# Patient Record
Sex: Female | Born: 1945
Health system: Southern US, Community
[De-identification: ages and names within clinical notes are randomized; demographics above are authoritative.]

## PROBLEM LIST (undated history)

## (undated) DIAGNOSIS — E079 Disorder of thyroid, unspecified: Secondary | ICD-10-CM

## (undated) DIAGNOSIS — E785 Hyperlipidemia, unspecified: Secondary | ICD-10-CM

## (undated) HISTORY — DX: Hyperlipidemia, unspecified: E78.5

---

## 2000-07-07 ENCOUNTER — Other Ambulatory Visit: Admission: RE | Admit: 2000-07-07 | Discharge: 2000-07-07 | Payer: Self-pay | Admitting: Family Medicine

## 2002-07-21 ENCOUNTER — Ambulatory Visit (HOSPITAL_COMMUNITY): Admission: RE | Admit: 2002-07-21 | Discharge: 2002-07-21 | Payer: Self-pay | Admitting: Internal Medicine

## 2002-09-20 ENCOUNTER — Other Ambulatory Visit: Admission: RE | Admit: 2002-09-20 | Discharge: 2002-09-20 | Payer: Self-pay | Admitting: Family Medicine

## 2003-12-04 ENCOUNTER — Other Ambulatory Visit: Admission: RE | Admit: 2003-12-04 | Discharge: 2003-12-04 | Payer: Self-pay | Admitting: Family Medicine

## 2011-02-16 ENCOUNTER — Ambulatory Visit
Admission: RE | Admit: 2011-02-16 | Discharge: 2011-02-16 | Disposition: A | Discharge status: Home / Self Care | Payer: BC Managed Care – PPO | Source: Ambulatory Visit | Attending: Gastroenterology | Admitting: Gastroenterology

## 2011-02-16 ENCOUNTER — Other Ambulatory Visit: Payer: Self-pay | Admitting: Gastroenterology

## 2012-07-27 IMAGING — US US ABDOMEN COMPLETE
1 series · 14 of 25 positions shown · non-contrast
Comparison: None.

CLINICAL DATA: Abdominal pain, nausea

COMPLETE ABDOMINAL ULTRASOUND

[Series 1: us abdomen complete · 0.33mm/px · 14 of 78 slices shown]
[im 1/78]
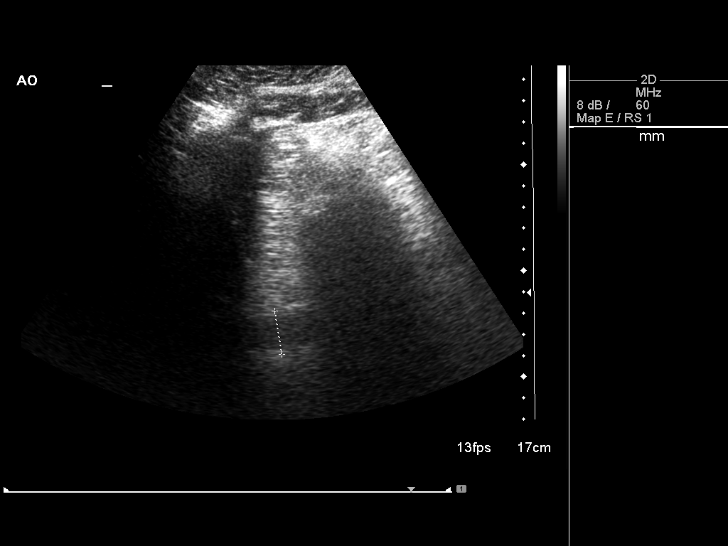
[im 7/78]
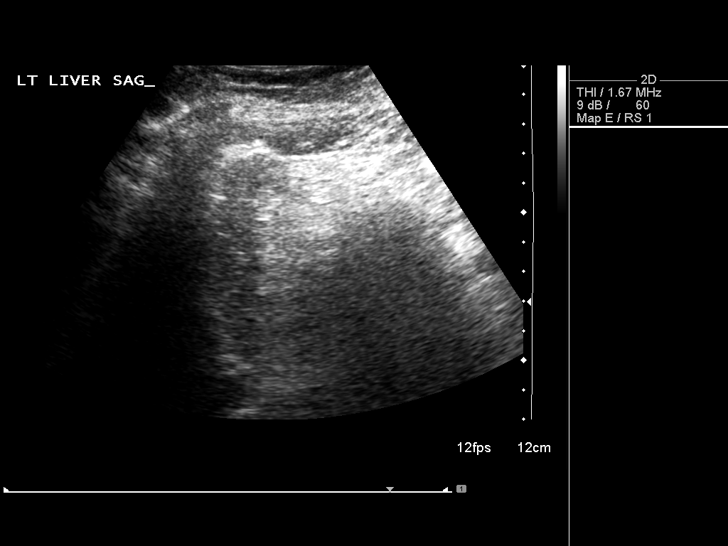
[im 13/78]
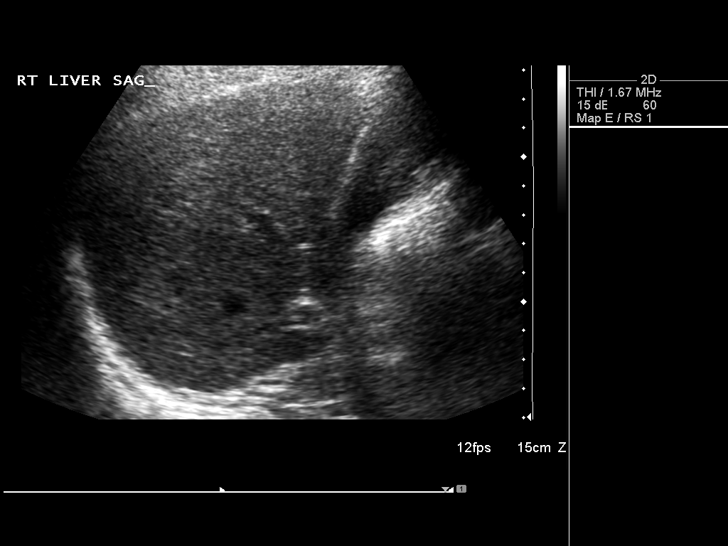
[im 20/78]
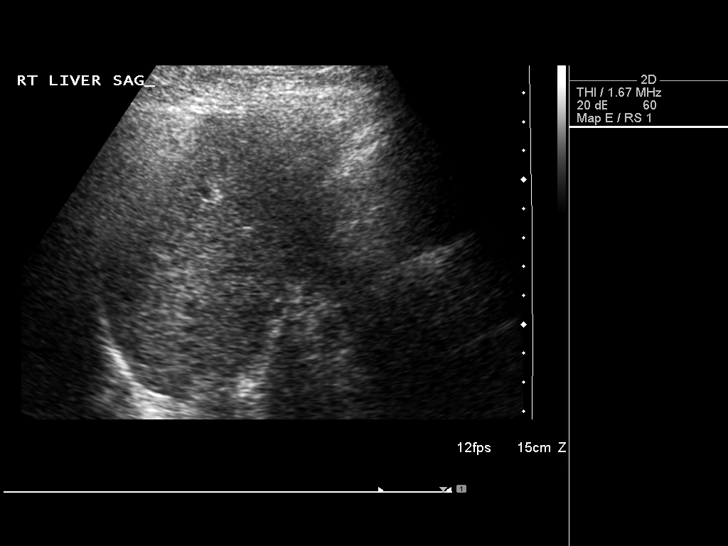
[im 26/78]
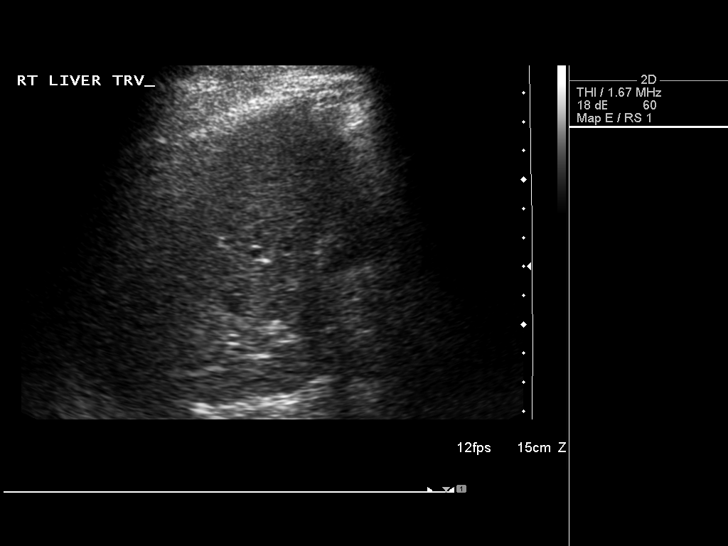
[im 29/78]
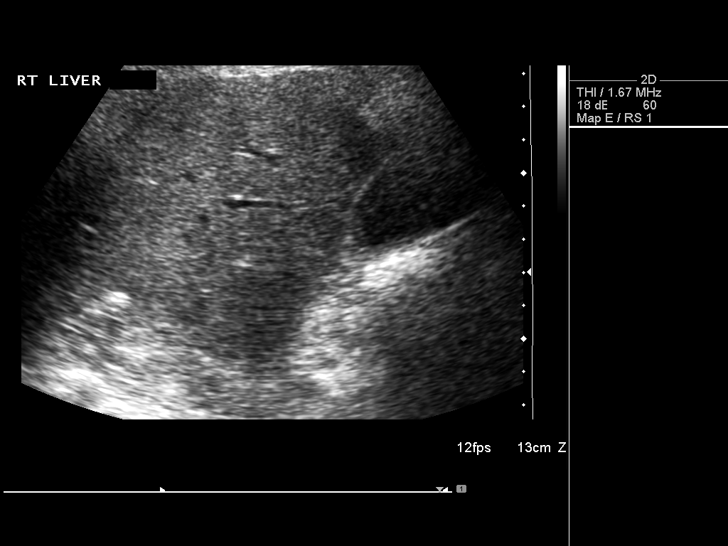
[im 36/78]
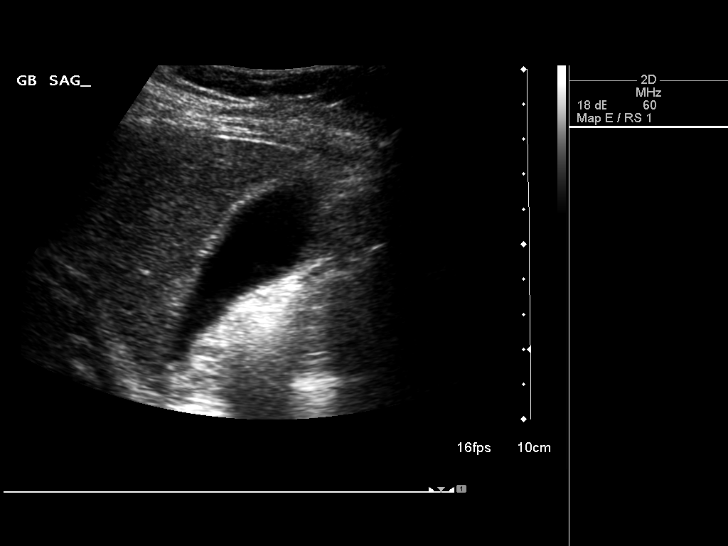
[im 42/78]
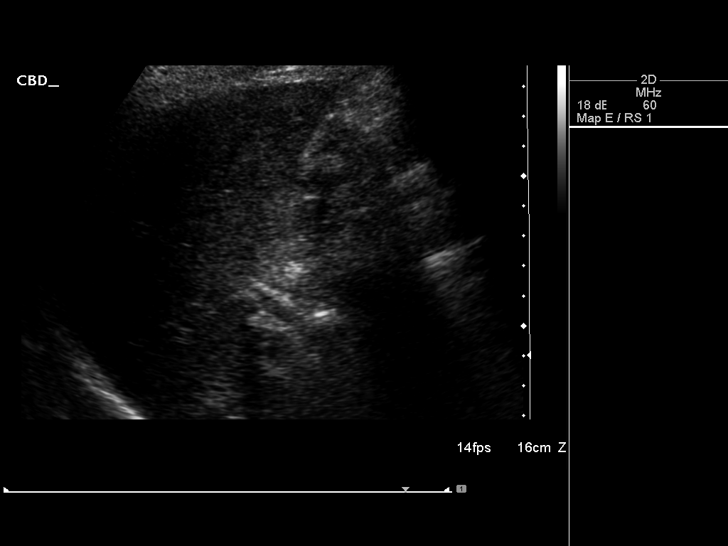
[im 49/78]
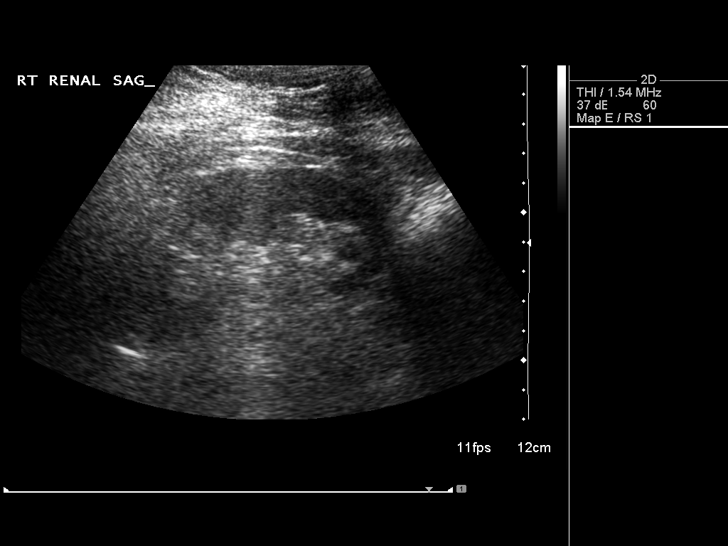
[im 52/78]
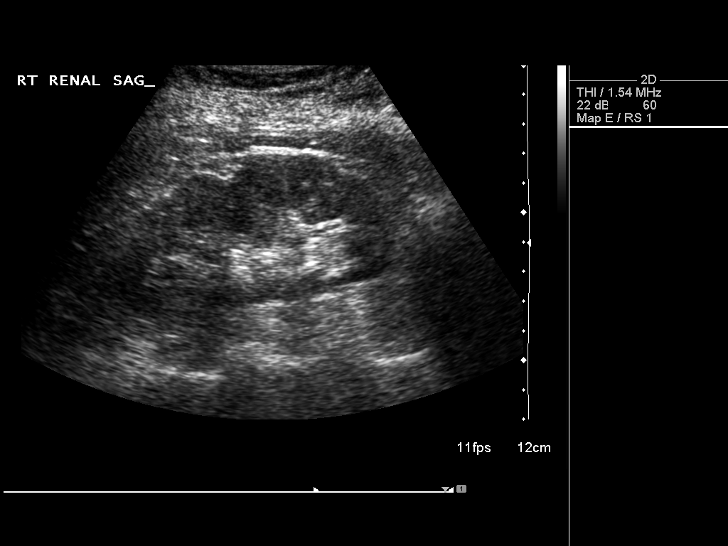
[im 58/78]
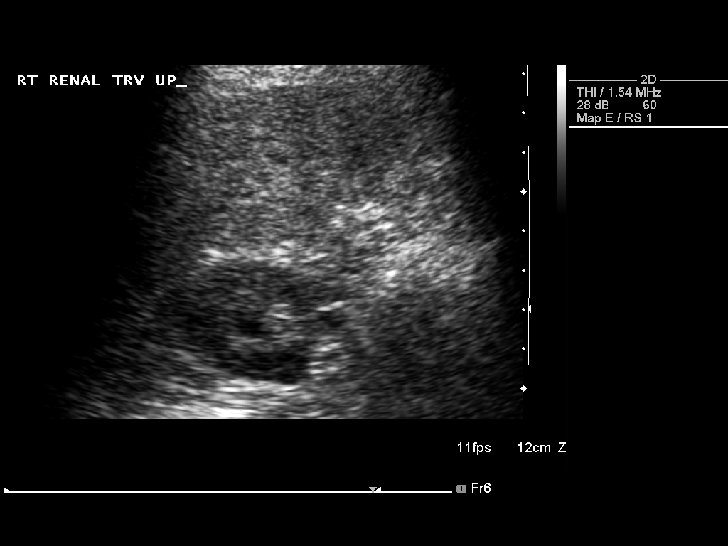
[im 65/78]
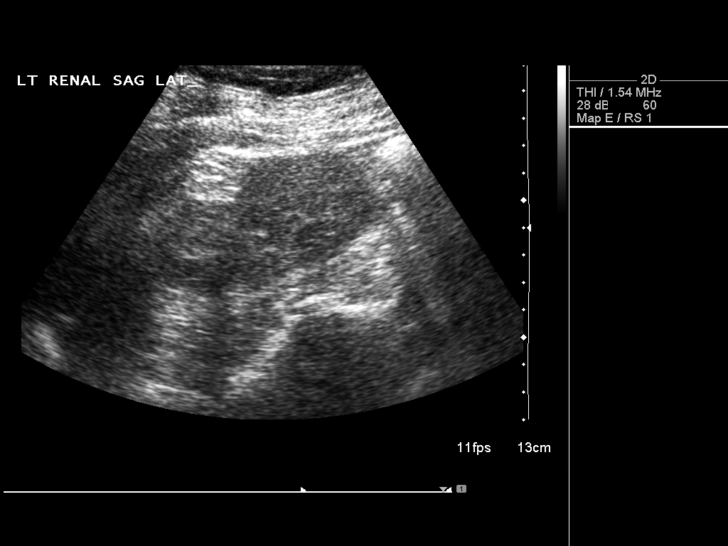
[im 71/78]
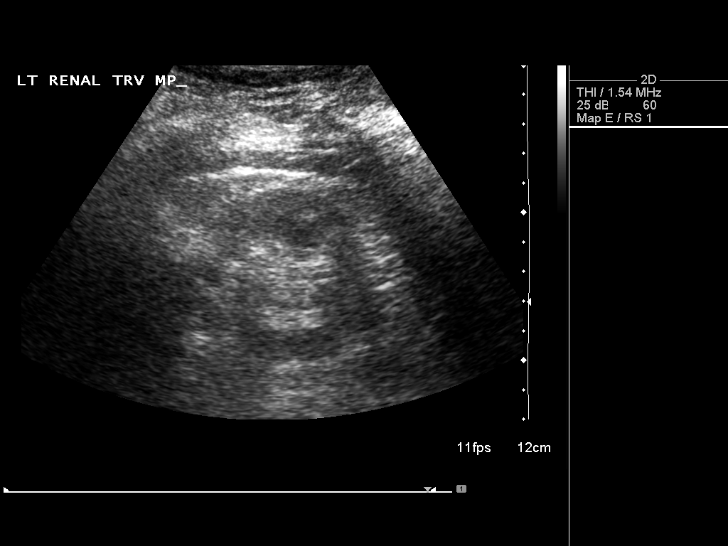
[im 78/78]
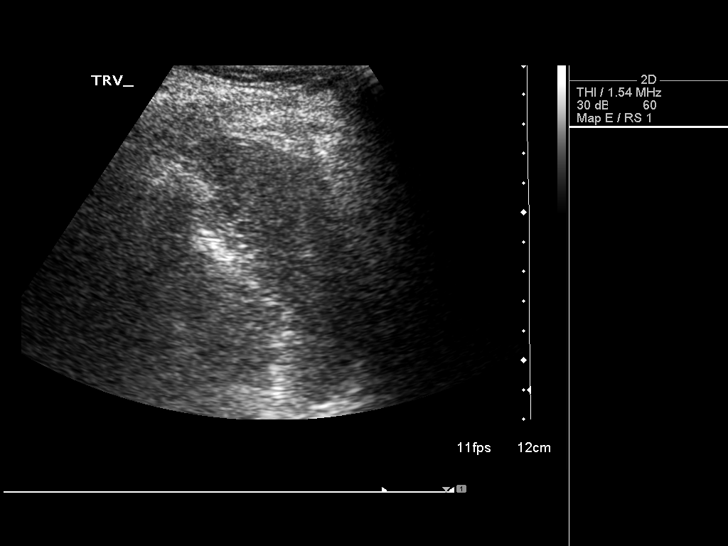

[14 of 25 positions shown; findings below may reference images not displayed]

FINDINGS: Gallbladder:  The gallbladder is visualized and no gallstones are
noted.  There is no pain over gallbladder with compression.

Common bile duct:  The common bile duct is normal measuring 5.0 mm
in diameter.

Liver:  The liver has a normal echogenic pattern.  No ductal
dilatation is seen.

IVC:  Appears normal.

Pancreas:  The pancreas is largely obscured by bowel gas.

Spleen:  The spleen is normal measuring 7.5 cm sagittally.

Right Kidney:  No hydronephrosis is seen.  The right kidney
measures 9.8 cm sagittally.

Left Kidney:  No hydronephrosis.  The left kidney measures 10.4 cm.

Abdominal aorta:  The abdominal aorta is normal in caliber.
IMPRESSION: Negative abdominal ultrasound.  No gallstones.  The pancreas
however is obscured by bowel gas.

## 2015-04-08 DIAGNOSIS — M25561 Pain in right knee: Secondary | ICD-10-CM | POA: Diagnosis not present

## 2015-04-11 DIAGNOSIS — M1711 Unilateral primary osteoarthritis, right knee: Secondary | ICD-10-CM | POA: Diagnosis not present

## 2015-04-11 DIAGNOSIS — M25561 Pain in right knee: Secondary | ICD-10-CM | POA: Diagnosis not present

## 2015-05-28 DIAGNOSIS — E039 Hypothyroidism, unspecified: Secondary | ICD-10-CM | POA: Diagnosis not present

## 2015-09-25 DIAGNOSIS — H0012 Chalazion right lower eyelid: Secondary | ICD-10-CM | POA: Diagnosis not present

## 2015-10-21 DIAGNOSIS — H00022 Hordeolum internum right lower eyelid: Secondary | ICD-10-CM | POA: Diagnosis not present

## 2015-12-25 DIAGNOSIS — E039 Hypothyroidism, unspecified: Secondary | ICD-10-CM | POA: Diagnosis not present

## 2016-01-20 DIAGNOSIS — H903 Sensorineural hearing loss, bilateral: Secondary | ICD-10-CM | POA: Diagnosis not present

## 2016-03-02 DIAGNOSIS — H524 Presbyopia: Secondary | ICD-10-CM | POA: Diagnosis not present

## 2016-03-02 DIAGNOSIS — H40013 Open angle with borderline findings, low risk, bilateral: Secondary | ICD-10-CM | POA: Diagnosis not present

## 2016-03-02 DIAGNOSIS — H5203 Hypermetropia, bilateral: Secondary | ICD-10-CM | POA: Diagnosis not present

## 2016-03-02 DIAGNOSIS — H52203 Unspecified astigmatism, bilateral: Secondary | ICD-10-CM | POA: Diagnosis not present

## 2016-03-02 DIAGNOSIS — H2513 Age-related nuclear cataract, bilateral: Secondary | ICD-10-CM | POA: Diagnosis not present

## 2016-09-06 DIAGNOSIS — H40013 Open angle with borderline findings, low risk, bilateral: Secondary | ICD-10-CM | POA: Diagnosis not present

## 2017-04-06 DIAGNOSIS — H40013 Open angle with borderline findings, low risk, bilateral: Secondary | ICD-10-CM | POA: Diagnosis not present

## 2017-04-06 DIAGNOSIS — H5203 Hypermetropia, bilateral: Secondary | ICD-10-CM | POA: Diagnosis not present

## 2017-04-06 DIAGNOSIS — H2513 Age-related nuclear cataract, bilateral: Secondary | ICD-10-CM | POA: Diagnosis not present

## 2017-04-06 DIAGNOSIS — H524 Presbyopia: Secondary | ICD-10-CM | POA: Diagnosis not present

## 2017-04-06 DIAGNOSIS — H52203 Unspecified astigmatism, bilateral: Secondary | ICD-10-CM | POA: Diagnosis not present

## 2017-05-09 ENCOUNTER — Other Ambulatory Visit: Payer: Self-pay | Admitting: Family Medicine

## 2017-05-09 ENCOUNTER — Ambulatory Visit
Admission: RE | Admit: 2017-05-09 | Discharge: 2017-05-09 | Disposition: A | Discharge status: Home / Self Care | Payer: Medicare HMO | Source: Ambulatory Visit | Attending: Family Medicine | Admitting: Family Medicine

## 2017-05-09 DIAGNOSIS — E785 Hyperlipidemia, unspecified: Secondary | ICD-10-CM | POA: Diagnosis not present

## 2017-05-09 DIAGNOSIS — J4531 Mild persistent asthma with (acute) exacerbation: Secondary | ICD-10-CM | POA: Diagnosis not present

## 2017-05-09 DIAGNOSIS — R0600 Dyspnea, unspecified: Secondary | ICD-10-CM | POA: Diagnosis not present

## 2017-05-09 DIAGNOSIS — E559 Vitamin D deficiency, unspecified: Secondary | ICD-10-CM | POA: Diagnosis not present

## 2017-05-09 DIAGNOSIS — M79602 Pain in left arm: Secondary | ICD-10-CM | POA: Diagnosis not present

## 2017-05-09 DIAGNOSIS — E039 Hypothyroidism, unspecified: Secondary | ICD-10-CM | POA: Diagnosis not present

## 2017-05-09 DIAGNOSIS — Z23 Encounter for immunization: Secondary | ICD-10-CM | POA: Diagnosis not present

## 2017-05-09 DIAGNOSIS — J90 Pleural effusion, not elsewhere classified: Secondary | ICD-10-CM | POA: Diagnosis not present

## 2017-10-07 ENCOUNTER — Encounter (HOSPITAL_COMMUNITY): Payer: Self-pay

## 2017-10-07 ENCOUNTER — Other Ambulatory Visit: Payer: Self-pay

## 2017-10-07 ENCOUNTER — Emergency Department (HOSPITAL_COMMUNITY)
Admission: EM | Admit: 2017-10-07 | Discharge: 2017-10-08 | Disposition: A | Discharge status: Home / Self Care | Payer: Medicare HMO | Attending: Emergency Medicine | Admitting: Emergency Medicine

## 2017-10-07 DIAGNOSIS — Y939 Activity, unspecified: Secondary | ICD-10-CM | POA: Diagnosis not present

## 2017-10-07 DIAGNOSIS — Y92009 Unspecified place in unspecified non-institutional (private) residence as the place of occurrence of the external cause: Secondary | ICD-10-CM | POA: Diagnosis not present

## 2017-10-07 DIAGNOSIS — W5911XA Bitten by nonvenomous snake, initial encounter: Secondary | ICD-10-CM | POA: Insufficient documentation

## 2017-10-07 DIAGNOSIS — Z79899 Other long term (current) drug therapy: Secondary | ICD-10-CM | POA: Insufficient documentation

## 2017-10-07 DIAGNOSIS — S91052A Open bite, left ankle, initial encounter: Secondary | ICD-10-CM | POA: Insufficient documentation

## 2017-10-07 DIAGNOSIS — Y999 Unspecified external cause status: Secondary | ICD-10-CM | POA: Insufficient documentation

## 2017-10-07 DIAGNOSIS — T63001A Toxic effect of unspecified snake venom, accidental (unintentional), initial encounter: Secondary | ICD-10-CM | POA: Diagnosis not present

## 2017-10-07 HISTORY — DX: Disorder of thyroid, unspecified: E07.9

## 2017-10-07 LAB — CBC WITH DIFFERENTIAL/PLATELET
BASOS ABS: 0 10*3/uL (ref 0.0–0.1)
BASOS PCT: 0 %
EOS PCT: 2 %
Eosinophils Absolute: 0.2 10*3/uL (ref 0.0–0.7)
HCT: 41.9 % (ref 36.0–46.0)
Hemoglobin: 13.7 g/dL (ref 12.0–15.0)
Lymphocytes Relative: 42 %
Lymphs Abs: 3.8 10*3/uL (ref 0.7–4.0)
MCH: 29.8 pg (ref 26.0–34.0)
MCHC: 32.7 g/dL (ref 30.0–36.0)
MCV: 91.3 fL (ref 78.0–100.0)
MONO ABS: 0.7 10*3/uL (ref 0.1–1.0)
MONOS PCT: 8 %
Neutro Abs: 4.4 10*3/uL (ref 1.7–7.7)
Neutrophils Relative %: 48 %
PLATELETS: 286 10*3/uL (ref 150–400)
RBC: 4.59 MIL/uL (ref 3.87–5.11)
RDW: 13.8 % (ref 11.5–15.5)
WBC: 9.1 10*3/uL (ref 4.0–10.5)

## 2017-10-07 LAB — BASIC METABOLIC PANEL
Anion gap: 7 (ref 5–15)
BUN: 12 mg/dL (ref 8–23)
CO2: 23 mmol/L (ref 22–32)
Calcium: 9 mg/dL (ref 8.9–10.3)
Chloride: 109 mmol/L (ref 98–111)
Creatinine, Ser: 1.1 mg/dL — ABNORMAL HIGH (ref 0.44–1.00)
GFR calc Af Amer: 56 mL/min — ABNORMAL LOW (ref >60)
GFR, EST NON AFRICAN AMERICAN: 48 mL/min — AB (ref >60)
GLUCOSE: 97 mg/dL (ref 70–99)
Potassium: 3.6 mmol/L (ref 3.5–5.1)
Sodium: 139 mmol/L (ref 135–145)

## 2017-10-07 LAB — FIBRINOGEN: FIBRINOGEN: 309 mg/dL (ref 210–475)

## 2017-10-07 LAB — PROTIME-INR
INR: 0.97
Prothrombin Time: 12.8 seconds (ref 11.4–15.2)

## 2017-10-07 MED ORDER — ONDANSETRON HCL 4 MG/2ML IJ SOLN
4.0000 mg | Freq: Once | INTRAMUSCULAR | Status: AC
  Administered 2017-10-07: 4 mg via INTRAVENOUS
  Filled 2017-10-07: qty 2

## 2017-10-07 MED ORDER — SODIUM CHLORIDE 0.9 % IV BOLUS
1000.0000 mL | Freq: Once | INTRAVENOUS | Status: AC
  Administered 2017-10-07: 1000 mL via INTRAVENOUS

## 2017-10-07 MED ORDER — TETANUS-DIPHTH-ACELL PERTUSSIS 5-2.5-18.5 LF-MCG/0.5 IM SUSP
0.5000 mL | Freq: Once | INTRAMUSCULAR | Status: DC
  Filled 2017-10-07: qty 0.5

## 2017-10-07 MED ORDER — HYDROCODONE-ACETAMINOPHEN 5-325 MG PO TABS: 1.0000 | ORAL_TABLET | ORAL | 0 refills | Status: DC | PRN

## 2017-10-07 MED ORDER — MORPHINE SULFATE (PF) 4 MG/ML IV SOLN
4.0000 mg | Freq: Once | INTRAVENOUS | Status: AC
  Administered 2017-10-07: 4 mg via INTRAVENOUS
  Filled 2017-10-07: qty 1

## 2017-10-07 MED ORDER — FENTANYL CITRATE (PF) 100 MCG/2ML IJ SOLN
50.0000 ug | INTRAMUSCULAR | Status: DC | PRN
  Administered 2017-10-07: 50 ug via INTRAVENOUS
  Filled 2017-10-07: qty 2

## 2017-10-07 NOTE — ED Provider Notes (Signed)
[facility] EMERGENCY DEPARTMENT Provider Note   CSN: [id number] Arrival date & time: 10/07/17  2050     History   Chief Complaint Chief Complaint  Patient presents with  . Snake Bite    HPI Melanie Butler is a 72 y.o. female.  Pt presents to the ED today with snake bite to left ankle.  The pt said she walked outside and felt something on her leg and saw the snake.  She brought the snake (dead) in for identification.  The pt put a tourniquet on her left leg prior to arrival here.     Past Medical History:  Diagnosis Date  . Thyroid disease     There are no active problems to display for this patient.   History reviewed. No pertinent surgical history.   OB History   None      Home Medications    Prior to Admission medications   Medication Sig Start Date End Date Taking? Authorizing Provider  albuterol (PROVENTIL HFA;VENTOLIN HFA) 108 (90 Base) MCG/ACT inhaler Inhale 1-2 puffs into the lungs every 6 (six) hours as needed for wheezing or shortness of breath.   Yes [provider]  Cholecalciferol (HM VITAMIN D3) 4000 units CAPS Take 4,000 Units by mouth daily.   Yes [provider]  levothyroxine (SYNTHROID, LEVOTHROID) 150 MCG tablet Take 150 mcg by mouth daily before breakfast.  10/03/17  Yes [provider]  HYDROcodone-acetaminophen (NORCO/VICODIN) 5-325 MG tablet Take 1 tablet by mouth every 4 (four) hours as needed. 10/07/17   Isla Pence, MD    Family History No family history on file.  Social History Social History   Tobacco Use  . Smoking status: Never Smoker  . Smokeless tobacco: Never Used  Substance Use Topics  . Alcohol use: Never    Frequency: Never  . Drug use: Never     Allergies   Ceclor [cefaclor] and Tetanus toxoids   Review of Systems Review of Systems  Skin: Positive for wound.  All other systems reviewed and are negative.    Physical Exam Updated Vital Signs BP 126/67   Pulse 66   Temp 97.7 F  (36.5 C) (Oral)   Resp 15   Ht 5\' 5"  (1.651 m)   Wt 74.8 kg   SpO2 95%   BMI 27.46 kg/m   Physical Exam  Constitutional: She is oriented to person, place, and time. She appears well-developed and well-nourished.  HENT:  Head: Normocephalic and atraumatic.  Right Ear: External ear normal.  Left Ear: External ear normal.  Nose: Nose normal.  Mouth/Throat: Oropharynx is clear and moist.  Eyes: Pupils are equal, round, and reactive to light. Conjunctivae and EOM are normal.  Neck: Normal range of motion. Neck supple.  Cardiovascular: Normal rate, regular rhythm, normal heart sounds and intact distal pulses.  Pulmonary/Chest: Effort normal and breath sounds normal.  Abdominal: Soft. Bowel sounds are normal.  Musculoskeletal:  2 puncture wounds to medial left ankle.  Mild swelling and redness around site.  Neurological: She is alert and oriented to person, place, and time.  Skin: Skin is warm. Capillary refill takes less than 2 seconds.  See picture (1 hr post bite)  Psychiatric: She has a normal mood and affect. Her behavior is normal. Judgment and thought content normal.  Nursing note and vitals reviewed.        ED Treatments / Results  Labs (all labs ordered are listed, but only abnormal results are displayed) Labs Reviewed  BASIC METABOLIC  PANEL - Abnormal; Notable for the following components:      Result Value   Creatinine, Ser 1.10 (*)    GFR calc non Af Amer 48 (*)    GFR calc Af Amer 56 (*)    All other components within normal limits  CBC WITH DIFFERENTIAL/PLATELET  PROTIME-INR  FIBRINOGEN  CBC WITH DIFFERENTIAL/PLATELET  CBC WITH DIFFERENTIAL/PLATELET  PROTIME-INR  PROTIME-INR  FIBRINOGEN  FIBRINOGEN    EKG EKG Interpretation  Date/Time:  Friday October 07 2017 21:02:10 EDT Ventricular Rate:  73 PR Interval:    QRS Duration: 101 QT Interval:  403 QTC Calculation: 445 R Axis:   70 Text Interpretation:  Sinus rhythm Baseline wander in lead(s) V3 No  old tracing to compare Confirmed by Isla Pence [phone]) on 10/07/2017 9:22:47 PM   Radiology No results found.  Procedures Procedures (including critical care time)  Medications Ordered in ED Medications  fentaNYL (SUBLIMAZE) injection 50 mcg (has no administration in time range)  sodium chloride 0.9 % bolus 1,000 mL (0 mLs Intravenous Stopped 10/07/17 2210)  morphine 4 MG/ML injection 4 mg (4 mg Intravenous Given 10/07/17 2203)  ondansetron (ZOFRAN) injection 4 mg (4 mg Intravenous Given 10/07/17 2203)     Initial Impression / Assessment and Plan / ED Course  I have reviewed the triage vital signs and the nursing notes.  Pertinent labs & imaging results that were available during my care of the patient were reviewed by me and considered in my medical decision making (see chart for details).    I suspect pt did not get a very large envenomation as foot is not very swollen. She does not meet criteria for crofab.  Pt will be signed out to Dr. Eliane Decree pending observation until 0100.    Final Clinical Impressions(s) / ED Diagnoses   Final diagnoses:  Snake bite, initial encounter    ED Discharge Orders         Ordered    HYDROcodone-acetaminophen (NORCO/VICODIN) 5-325 MG tablet  Every 4 hours PRN     10/07/17 2327           Isla Pence, MD 10/07/17 2342

## 2017-10-07 NOTE — ED Triage Notes (Signed)
Pt was bit on the inside of her left ankle by a copperhead (confirmed by positive identification-dead snake brought to e.d.).  Pt having minimal swelling or redness to area of 2 puncture marks.

## 2017-10-08 DIAGNOSIS — Z79899 Other long term (current) drug therapy: Secondary | ICD-10-CM | POA: Diagnosis not present

## 2017-10-08 DIAGNOSIS — S91052A Open bite, left ankle, initial encounter: Secondary | ICD-10-CM | POA: Diagnosis not present

## 2017-10-08 DIAGNOSIS — W5911XA Bitten by nonvenomous snake, initial encounter: Secondary | ICD-10-CM | POA: Diagnosis not present

## 2017-10-08 LAB — CBC WITH DIFFERENTIAL/PLATELET
BASOS PCT: 0 %
Basophils Absolute: 0 10*3/uL (ref 0.0–0.1)
EOS ABS: 0.1 10*3/uL (ref 0.0–0.7)
Eosinophils Relative: 2 %
HEMATOCRIT: 39.9 % (ref 36.0–46.0)
Hemoglobin: 12.8 g/dL (ref 12.0–15.0)
Lymphocytes Relative: 26 %
Lymphs Abs: 2.2 10*3/uL (ref 0.7–4.0)
MCH: 29.4 pg (ref 26.0–34.0)
MCHC: 32.1 g/dL (ref 30.0–36.0)
MCV: 91.5 fL (ref 78.0–100.0)
MONO ABS: 0.4 10*3/uL (ref 0.1–1.0)
Monocytes Relative: 5 %
NEUTROS ABS: 5.8 10*3/uL (ref 1.7–7.7)
Neutrophils Relative %: 67 %
PLATELETS: 258 10*3/uL (ref 150–400)
RBC: 4.36 MIL/uL (ref 3.87–5.11)
RDW: 13.8 % (ref 11.5–15.5)
WBC: 8.6 10*3/uL (ref 4.0–10.5)

## 2017-10-08 LAB — PROTIME-INR
INR: 1.05
Prothrombin Time: 13.6 seconds (ref 11.4–15.2)

## 2017-10-08 LAB — FIBRINOGEN: FIBRINOGEN: 263 mg/dL (ref 210–475)

## 2017-10-08 NOTE — Discharge Instructions (Signed)
Recheck for any worsening symptoms.

## 2017-10-08 NOTE — ED Provider Notes (Signed)
Pt states swelling is about the same. ? Small swelling outside of the marked area.   Second set of blood work is still normal.  Patient was discharged home.    Results for orders placed or performed during the hospital encounter of 42/70/62  Basic metabolic panel  Result Value Ref Range   Sodium 139 135 - 145 mmol/L   Potassium 3.6 3.5 - 5.1 mmol/L   Chloride 109 98 - 111 mmol/L   CO2 23 22 - 32 mmol/L   Glucose, Bld 97 70 - 99 mg/dL   BUN 12 8 - 23 mg/dL   Creatinine, Ser 1.10 (H) 0.44 - 1.00 mg/dL   Calcium 9.0 8.9 - 10.3 mg/dL   GFR calc non Af Amer 48 (L) >60 mL/min   GFR calc Af Amer 56 (L) >60 mL/min   Anion gap 7 5 - 15  CBC WITH DIFFERENTIAL  Result Value Ref Range   WBC 9.1 4.0 - 10.5 K/uL   RBC 4.59 3.87 - 5.11 MIL/uL   Hemoglobin 13.7 12.0 - 15.0 g/dL   HCT 41.9 36.0 - 46.0 %   MCV 91.3 78.0 - 100.0 fL   MCH 29.8 26.0 - 34.0 pg   MCHC 32.7 30.0 - 36.0 g/dL   RDW 13.8 11.5 - 15.5 %   Platelets 286 150 - 400 K/uL   Neutrophils Relative % 48 %   Neutro Abs 4.4 1.7 - 7.7 K/uL   Lymphocytes Relative 42 %   Lymphs Abs 3.8 0.7 - 4.0 K/uL   Monocytes Relative 8 %   Monocytes Absolute 0.7 0.1 - 1.0 K/uL   Eosinophils Relative 2 %   Eosinophils Absolute 0.2 0.0 - 0.7 K/uL   Basophils Relative 0 %   Basophils Absolute 0.0 0.0 - 0.1 K/uL  CBC WITH DIFFERENTIAL  Result Value Ref Range   WBC 8.6 4.0 - 10.5 K/uL   RBC 4.36 3.87 - 5.11 MIL/uL   Hemoglobin 12.8 12.0 - 15.0 g/dL   HCT 39.9 36.0 - 46.0 %   MCV 91.5 78.0 - 100.0 fL   MCH 29.4 26.0 - 34.0 pg   MCHC 32.1 30.0 - 36.0 g/dL   RDW 13.8 11.5 - 15.5 %   Platelets 258 150 - 400 K/uL   Neutrophils Relative % 67 %   Neutro Abs 5.8 1.7 - 7.7 K/uL   Lymphocytes Relative 26 %   Lymphs Abs 2.2 0.7 - 4.0 K/uL   Monocytes Relative 5 %   Monocytes Absolute 0.4 0.1 - 1.0 K/uL   Eosinophils Relative 2 %   Eosinophils Absolute 0.1 0.0 - 0.7 K/uL   Basophils Relative 0 %   Basophils Absolute 0.0 0.0 - 0.1 K/uL    Protime-INR  Result Value Ref Range   Prothrombin Time 12.8 11.4 - 15.2 seconds   INR 0.97   Protime-INR  Result Value Ref Range   Prothrombin Time 13.6 11.4 - 15.2 seconds   INR 1.05   Fibrinogen  Result Value Ref Range   Fibrinogen 309 210 - 475 mg/dL  Fibrinogen  Result Value Ref Range   Fibrinogen 263 210 - 475 mg/dL   Laboratory interpretation all normal except renal insufficiency     Rolland Porter, MD 10/08/17 3762

## 2017-10-10 DIAGNOSIS — S91052D Open bite, left ankle, subsequent encounter: Secondary | ICD-10-CM | POA: Diagnosis not present

## 2017-10-10 DIAGNOSIS — W5911XD Bitten by nonvenomous snake, subsequent encounter: Secondary | ICD-10-CM | POA: Diagnosis not present

## 2017-10-10 DIAGNOSIS — M79605 Pain in left leg: Secondary | ICD-10-CM | POA: Diagnosis not present

## 2017-10-11 DIAGNOSIS — M7989 Other specified soft tissue disorders: Secondary | ICD-10-CM | POA: Diagnosis not present

## 2018-01-02 DIAGNOSIS — L821 Other seborrheic keratosis: Secondary | ICD-10-CM | POA: Diagnosis not present

## 2018-01-05 DIAGNOSIS — Z1231 Encounter for screening mammogram for malignant neoplasm of breast: Secondary | ICD-10-CM | POA: Diagnosis not present

## 2018-01-30 DIAGNOSIS — H40013 Open angle with borderline findings, low risk, bilateral: Secondary | ICD-10-CM | POA: Diagnosis not present

## 2018-03-15 DIAGNOSIS — E559 Vitamin D deficiency, unspecified: Secondary | ICD-10-CM | POA: Diagnosis not present

## 2018-03-15 DIAGNOSIS — R222 Localized swelling, mass and lump, trunk: Secondary | ICD-10-CM | POA: Diagnosis not present

## 2018-03-15 DIAGNOSIS — E785 Hyperlipidemia, unspecified: Secondary | ICD-10-CM | POA: Diagnosis not present

## 2018-03-15 DIAGNOSIS — E039 Hypothyroidism, unspecified: Secondary | ICD-10-CM | POA: Diagnosis not present

## 2018-03-15 DIAGNOSIS — N644 Mastodynia: Secondary | ICD-10-CM | POA: Diagnosis not present

## 2018-03-29 DIAGNOSIS — R591 Generalized enlarged lymph nodes: Secondary | ICD-10-CM | POA: Diagnosis not present

## 2018-05-01 DIAGNOSIS — H2513 Age-related nuclear cataract, bilateral: Secondary | ICD-10-CM | POA: Diagnosis not present

## 2018-05-01 DIAGNOSIS — H40013 Open angle with borderline findings, low risk, bilateral: Secondary | ICD-10-CM | POA: Diagnosis not present

## 2018-05-01 DIAGNOSIS — H52203 Unspecified astigmatism, bilateral: Secondary | ICD-10-CM | POA: Diagnosis not present

## 2018-05-01 DIAGNOSIS — H5203 Hypermetropia, bilateral: Secondary | ICD-10-CM | POA: Diagnosis not present

## 2018-05-01 DIAGNOSIS — H25013 Cortical age-related cataract, bilateral: Secondary | ICD-10-CM | POA: Diagnosis not present

## 2018-05-01 DIAGNOSIS — H524 Presbyopia: Secondary | ICD-10-CM | POA: Diagnosis not present

## 2018-05-15 DIAGNOSIS — H903 Sensorineural hearing loss, bilateral: Secondary | ICD-10-CM | POA: Diagnosis not present

## 2018-10-04 DIAGNOSIS — R69 Illness, unspecified: Secondary | ICD-10-CM | POA: Diagnosis not present

## 2018-10-18 IMAGING — DX DG CHEST 2V
2 series · 2 of 2 positions shown · non-contrast
Comparison: None.

CLINICAL DATA: 71-year-old female with a history of chronic
shortness of breath

EXAM:
CHEST - 2 VIEW

[dg chest 2 view (1 of 2)]
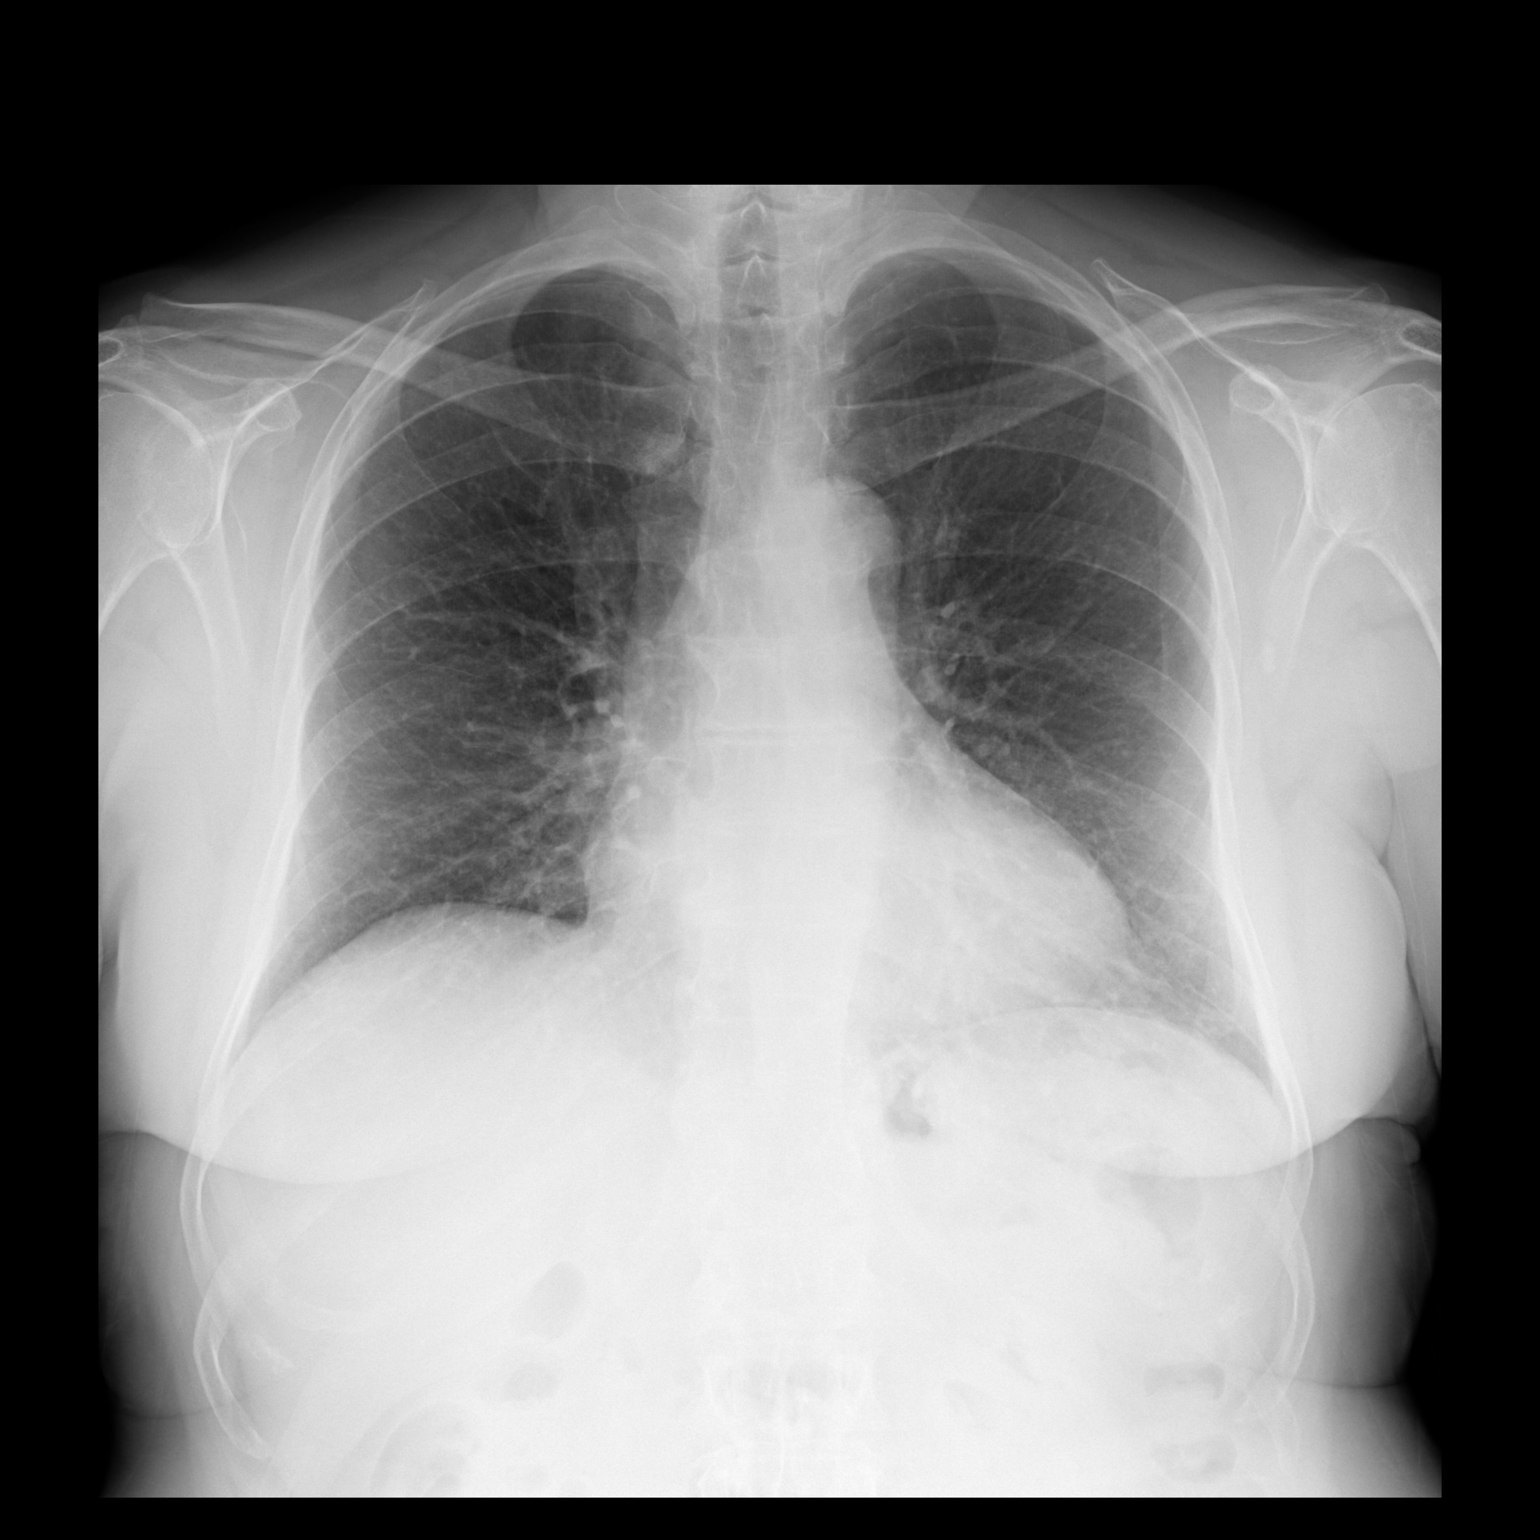

[dg chest 2 view (2 of 2)]
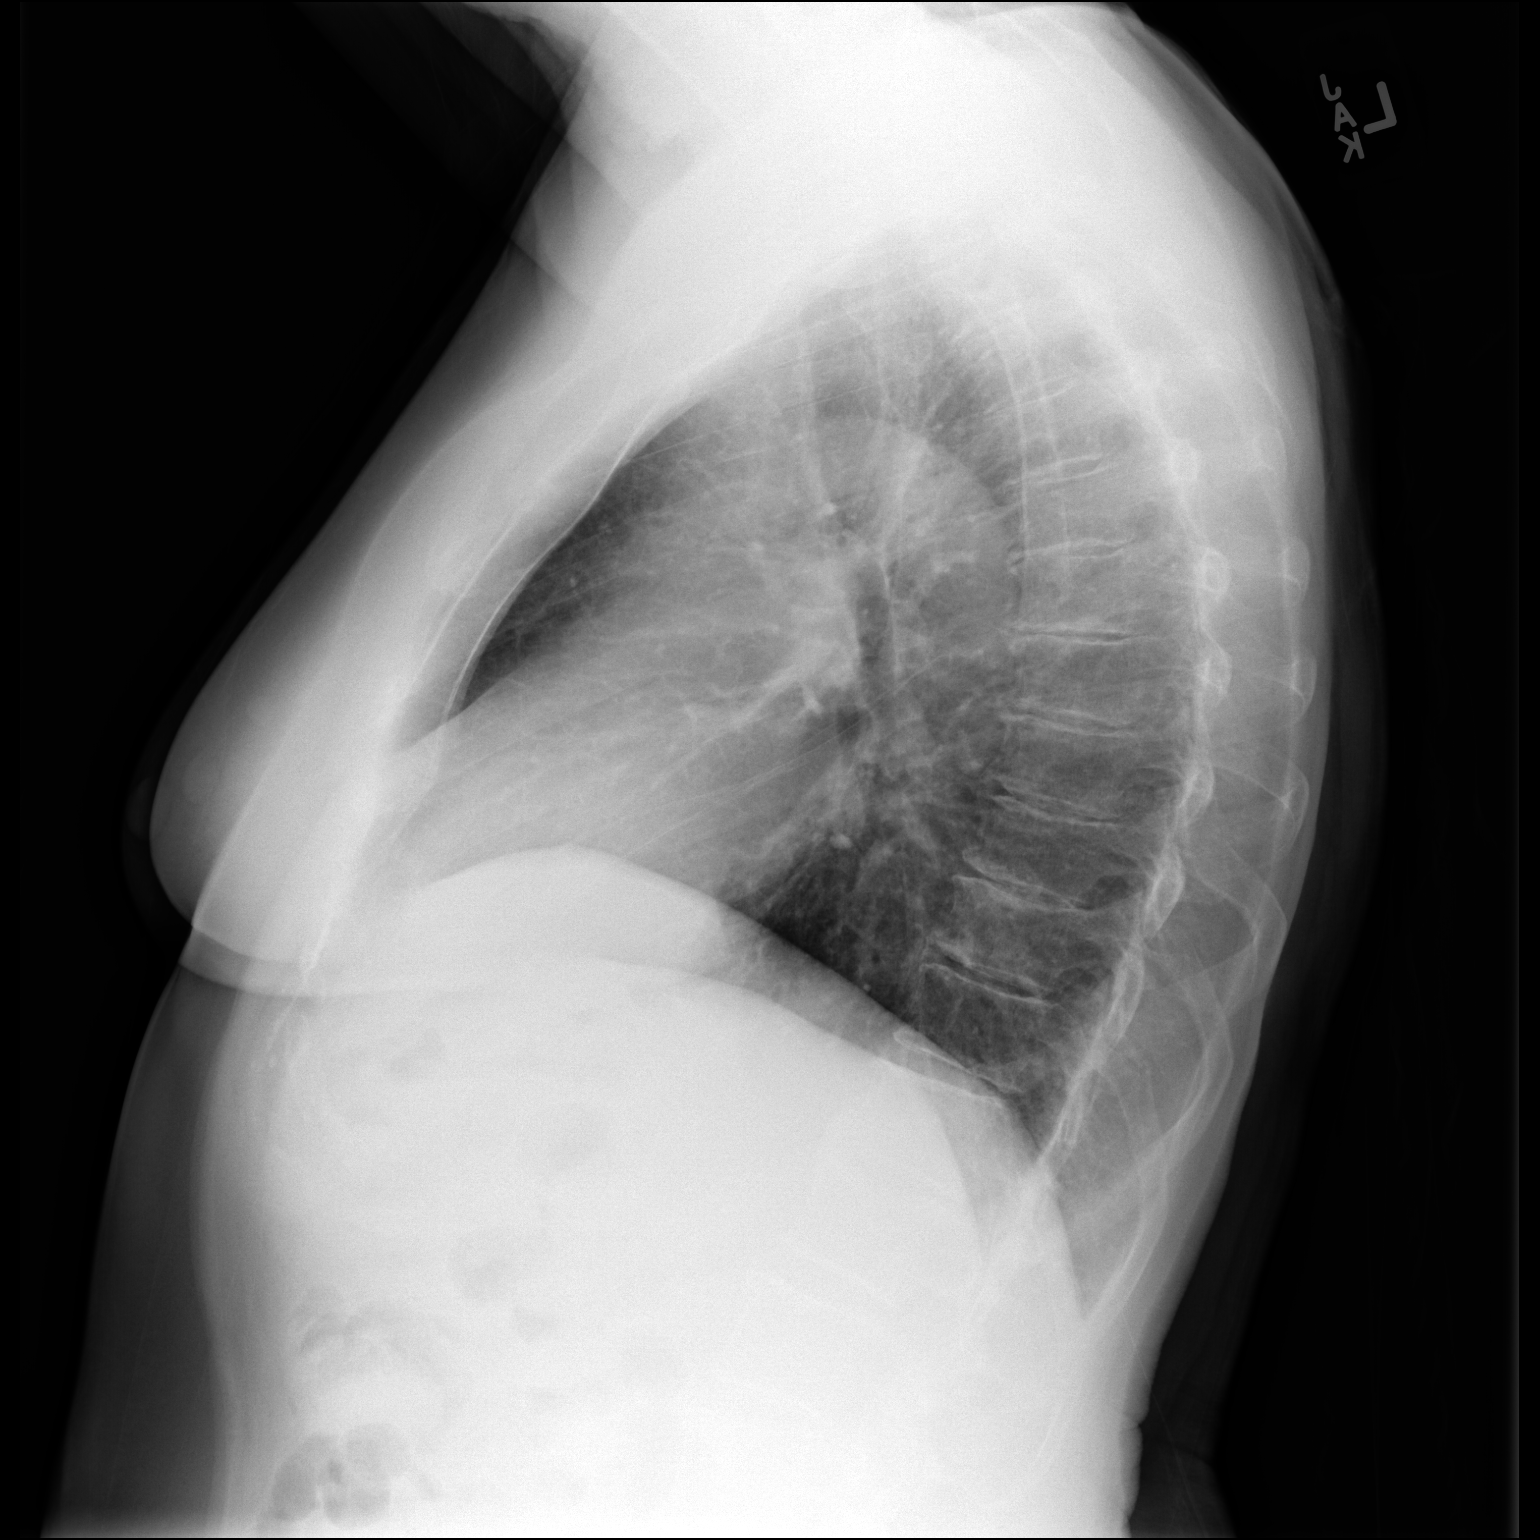

[2 of 2 positions shown; findings below may reference images not displayed]

FINDINGS: Cardiomediastinal silhouette within normal limits in size and
contour. No evidence of central vascular congestion. No pneumothorax
or pleural effusion. No interlobular septal thickening. No confluent
airspace disease.

Degenerative changes of the thoracic spine.
IMPRESSION: No radiographic evidence of acute cardiopulmonary disease

## 2018-11-07 DIAGNOSIS — H40003 Preglaucoma, unspecified, bilateral: Secondary | ICD-10-CM | POA: Diagnosis not present

## 2018-11-07 DIAGNOSIS — H40013 Open angle with borderline findings, low risk, bilateral: Secondary | ICD-10-CM | POA: Diagnosis not present

## 2018-12-27 DIAGNOSIS — E785 Hyperlipidemia, unspecified: Secondary | ICD-10-CM | POA: Diagnosis not present

## 2018-12-27 DIAGNOSIS — J45909 Unspecified asthma, uncomplicated: Secondary | ICD-10-CM | POA: Diagnosis not present

## 2018-12-27 DIAGNOSIS — M810 Age-related osteoporosis without current pathological fracture: Secondary | ICD-10-CM | POA: Diagnosis not present

## 2018-12-27 DIAGNOSIS — J4531 Mild persistent asthma with (acute) exacerbation: Secondary | ICD-10-CM | POA: Diagnosis not present

## 2018-12-27 DIAGNOSIS — J4520 Mild intermittent asthma, uncomplicated: Secondary | ICD-10-CM | POA: Diagnosis not present

## 2018-12-27 DIAGNOSIS — E039 Hypothyroidism, unspecified: Secondary | ICD-10-CM | POA: Diagnosis not present

## 2019-04-01 DIAGNOSIS — E039 Hypothyroidism, unspecified: Secondary | ICD-10-CM | POA: Diagnosis not present

## 2019-04-01 DIAGNOSIS — J45909 Unspecified asthma, uncomplicated: Secondary | ICD-10-CM | POA: Diagnosis not present

## 2019-04-01 DIAGNOSIS — J4520 Mild intermittent asthma, uncomplicated: Secondary | ICD-10-CM | POA: Diagnosis not present

## 2019-04-01 DIAGNOSIS — E785 Hyperlipidemia, unspecified: Secondary | ICD-10-CM | POA: Diagnosis not present

## 2019-04-01 DIAGNOSIS — J4531 Mild persistent asthma with (acute) exacerbation: Secondary | ICD-10-CM | POA: Diagnosis not present

## 2019-04-01 DIAGNOSIS — M810 Age-related osteoporosis without current pathological fracture: Secondary | ICD-10-CM | POA: Diagnosis not present

## 2019-07-23 DIAGNOSIS — Z0000 Encounter for general adult medical examination without abnormal findings: Secondary | ICD-10-CM | POA: Diagnosis not present

## 2019-07-23 DIAGNOSIS — Z1159 Encounter for screening for other viral diseases: Secondary | ICD-10-CM | POA: Diagnosis not present

## 2019-07-23 DIAGNOSIS — M810 Age-related osteoporosis without current pathological fracture: Secondary | ICD-10-CM | POA: Diagnosis not present

## 2019-07-23 DIAGNOSIS — M5431 Sciatica, right side: Secondary | ICD-10-CM | POA: Diagnosis not present

## 2019-07-23 DIAGNOSIS — J4520 Mild intermittent asthma, uncomplicated: Secondary | ICD-10-CM | POA: Diagnosis not present

## 2019-07-23 DIAGNOSIS — E039 Hypothyroidism, unspecified: Secondary | ICD-10-CM | POA: Diagnosis not present

## 2019-07-23 DIAGNOSIS — R030 Elevated blood-pressure reading, without diagnosis of hypertension: Secondary | ICD-10-CM | POA: Diagnosis not present

## 2019-07-23 DIAGNOSIS — Z86010 Personal history of colonic polyps: Secondary | ICD-10-CM | POA: Diagnosis not present

## 2019-07-23 DIAGNOSIS — E785 Hyperlipidemia, unspecified: Secondary | ICD-10-CM | POA: Diagnosis not present

## 2019-07-23 DIAGNOSIS — E559 Vitamin D deficiency, unspecified: Secondary | ICD-10-CM | POA: Diagnosis not present

## 2019-08-02 DIAGNOSIS — Z86010 Personal history of colonic polyps: Secondary | ICD-10-CM | POA: Diagnosis not present

## 2019-08-02 DIAGNOSIS — K5730 Diverticulosis of large intestine without perforation or abscess without bleeding: Secondary | ICD-10-CM | POA: Diagnosis not present

## 2019-08-02 DIAGNOSIS — Z1211 Encounter for screening for malignant neoplasm of colon: Secondary | ICD-10-CM | POA: Diagnosis not present

## 2019-08-08 DIAGNOSIS — M8589 Other specified disorders of bone density and structure, multiple sites: Secondary | ICD-10-CM | POA: Diagnosis not present

## 2019-08-08 DIAGNOSIS — Z1231 Encounter for screening mammogram for malignant neoplasm of breast: Secondary | ICD-10-CM | POA: Diagnosis not present

## 2019-08-08 DIAGNOSIS — M810 Age-related osteoporosis without current pathological fracture: Secondary | ICD-10-CM | POA: Diagnosis not present

## 2019-08-10 DIAGNOSIS — H524 Presbyopia: Secondary | ICD-10-CM | POA: Diagnosis not present

## 2019-08-10 DIAGNOSIS — H2513 Age-related nuclear cataract, bilateral: Secondary | ICD-10-CM | POA: Diagnosis not present

## 2019-08-10 DIAGNOSIS — H52203 Unspecified astigmatism, bilateral: Secondary | ICD-10-CM | POA: Diagnosis not present

## 2019-08-10 DIAGNOSIS — H25013 Cortical age-related cataract, bilateral: Secondary | ICD-10-CM | POA: Diagnosis not present

## 2019-08-10 DIAGNOSIS — H5203 Hypermetropia, bilateral: Secondary | ICD-10-CM | POA: Diagnosis not present

## 2019-08-10 DIAGNOSIS — H40013 Open angle with borderline findings, low risk, bilateral: Secondary | ICD-10-CM | POA: Diagnosis not present

## 2019-09-28 DIAGNOSIS — E039 Hypothyroidism, unspecified: Secondary | ICD-10-CM | POA: Diagnosis not present

## 2019-09-28 DIAGNOSIS — E785 Hyperlipidemia, unspecified: Secondary | ICD-10-CM | POA: Diagnosis not present

## 2019-09-28 DIAGNOSIS — Z79899 Other long term (current) drug therapy: Secondary | ICD-10-CM | POA: Diagnosis not present

## 2019-12-31 DIAGNOSIS — E039 Hypothyroidism, unspecified: Secondary | ICD-10-CM | POA: Diagnosis not present

## 2020-07-22 DIAGNOSIS — H903 Sensorineural hearing loss, bilateral: Secondary | ICD-10-CM | POA: Diagnosis not present

## 2020-08-07 DIAGNOSIS — Z0000 Encounter for general adult medical examination without abnormal findings: Secondary | ICD-10-CM | POA: Diagnosis not present

## 2020-08-07 DIAGNOSIS — E039 Hypothyroidism, unspecified: Secondary | ICD-10-CM | POA: Diagnosis not present

## 2020-08-07 DIAGNOSIS — M25562 Pain in left knee: Secondary | ICD-10-CM | POA: Diagnosis not present

## 2020-08-07 DIAGNOSIS — R69 Illness, unspecified: Secondary | ICD-10-CM | POA: Diagnosis not present

## 2020-08-07 DIAGNOSIS — M810 Age-related osteoporosis without current pathological fracture: Secondary | ICD-10-CM | POA: Diagnosis not present

## 2020-08-07 DIAGNOSIS — J4520 Mild intermittent asthma, uncomplicated: Secondary | ICD-10-CM | POA: Diagnosis not present

## 2020-08-07 DIAGNOSIS — E559 Vitamin D deficiency, unspecified: Secondary | ICD-10-CM | POA: Diagnosis not present

## 2020-08-07 DIAGNOSIS — E785 Hyperlipidemia, unspecified: Secondary | ICD-10-CM | POA: Diagnosis not present

## 2020-08-18 DIAGNOSIS — M79605 Pain in left leg: Secondary | ICD-10-CM | POA: Diagnosis not present

## 2021-05-14 DIAGNOSIS — E039 Hypothyroidism, unspecified: Secondary | ICD-10-CM | POA: Diagnosis not present

## 2021-08-13 DIAGNOSIS — C44329 Squamous cell carcinoma of skin of other parts of face: Secondary | ICD-10-CM | POA: Diagnosis not present

## 2021-08-13 DIAGNOSIS — L821 Other seborrheic keratosis: Secondary | ICD-10-CM | POA: Diagnosis not present

## 2021-08-13 DIAGNOSIS — L814 Other melanin hyperpigmentation: Secondary | ICD-10-CM | POA: Diagnosis not present

## 2021-08-13 DIAGNOSIS — Z85828 Personal history of other malignant neoplasm of skin: Secondary | ICD-10-CM | POA: Diagnosis not present

## 2021-08-13 DIAGNOSIS — C44622 Squamous cell carcinoma of skin of right upper limb, including shoulder: Secondary | ICD-10-CM | POA: Diagnosis not present

## 2021-08-13 DIAGNOSIS — L720 Epidermal cyst: Secondary | ICD-10-CM | POA: Diagnosis not present

## 2021-08-13 DIAGNOSIS — D225 Melanocytic nevi of trunk: Secondary | ICD-10-CM | POA: Diagnosis not present

## 2021-08-13 DIAGNOSIS — L570 Actinic keratosis: Secondary | ICD-10-CM | POA: Diagnosis not present

## 2021-08-13 DIAGNOSIS — D1801 Hemangioma of skin and subcutaneous tissue: Secondary | ICD-10-CM | POA: Diagnosis not present

## 2021-09-09 DIAGNOSIS — E785 Hyperlipidemia, unspecified: Secondary | ICD-10-CM | POA: Diagnosis not present

## 2021-09-09 DIAGNOSIS — Z0000 Encounter for general adult medical examination without abnormal findings: Secondary | ICD-10-CM | POA: Diagnosis not present

## 2021-09-09 DIAGNOSIS — E039 Hypothyroidism, unspecified: Secondary | ICD-10-CM | POA: Diagnosis not present

## 2021-09-09 DIAGNOSIS — M810 Age-related osteoporosis without current pathological fracture: Secondary | ICD-10-CM | POA: Diagnosis not present

## 2021-09-09 DIAGNOSIS — M159 Polyosteoarthritis, unspecified: Secondary | ICD-10-CM | POA: Diagnosis not present

## 2021-09-09 DIAGNOSIS — J4520 Mild intermittent asthma, uncomplicated: Secondary | ICD-10-CM | POA: Diagnosis not present

## 2021-09-09 DIAGNOSIS — Z86010 Personal history of colonic polyps: Secondary | ICD-10-CM | POA: Diagnosis not present

## 2021-09-22 DIAGNOSIS — M25561 Pain in right knee: Secondary | ICD-10-CM | POA: Diagnosis not present

## 2021-09-22 DIAGNOSIS — M25572 Pain in left ankle and joints of left foot: Secondary | ICD-10-CM | POA: Diagnosis not present

## 2021-10-06 DIAGNOSIS — M25661 Stiffness of right knee, not elsewhere classified: Secondary | ICD-10-CM | POA: Diagnosis not present

## 2021-10-06 DIAGNOSIS — M25561 Pain in right knee: Secondary | ICD-10-CM | POA: Diagnosis not present

## 2021-10-14 DIAGNOSIS — H524 Presbyopia: Secondary | ICD-10-CM | POA: Diagnosis not present

## 2021-10-14 DIAGNOSIS — H2513 Age-related nuclear cataract, bilateral: Secondary | ICD-10-CM | POA: Diagnosis not present

## 2021-10-14 DIAGNOSIS — H5203 Hypermetropia, bilateral: Secondary | ICD-10-CM | POA: Diagnosis not present

## 2021-10-14 DIAGNOSIS — H40013 Open angle with borderline findings, low risk, bilateral: Secondary | ICD-10-CM | POA: Diagnosis not present

## 2021-10-14 DIAGNOSIS — H52203 Unspecified astigmatism, bilateral: Secondary | ICD-10-CM | POA: Diagnosis not present

## 2021-10-14 DIAGNOSIS — H25013 Cortical age-related cataract, bilateral: Secondary | ICD-10-CM | POA: Diagnosis not present

## 2021-10-27 DIAGNOSIS — E039 Hypothyroidism, unspecified: Secondary | ICD-10-CM | POA: Diagnosis not present

## 2021-10-27 DIAGNOSIS — N289 Disorder of kidney and ureter, unspecified: Secondary | ICD-10-CM | POA: Diagnosis not present

## 2022-09-16 DIAGNOSIS — Z0000 Encounter for general adult medical examination without abnormal findings: Secondary | ICD-10-CM | POA: Diagnosis not present

## 2022-09-16 DIAGNOSIS — R0609 Other forms of dyspnea: Secondary | ICD-10-CM | POA: Diagnosis not present

## 2022-09-16 DIAGNOSIS — M19072 Primary osteoarthritis, left ankle and foot: Secondary | ICD-10-CM | POA: Diagnosis not present

## 2022-09-16 DIAGNOSIS — J4520 Mild intermittent asthma, uncomplicated: Secondary | ICD-10-CM | POA: Diagnosis not present

## 2022-09-16 DIAGNOSIS — E785 Hyperlipidemia, unspecified: Secondary | ICD-10-CM | POA: Diagnosis not present

## 2022-09-16 DIAGNOSIS — Z86010 Personal history of colonic polyps: Secondary | ICD-10-CM | POA: Diagnosis not present

## 2022-09-16 DIAGNOSIS — M159 Polyosteoarthritis, unspecified: Secondary | ICD-10-CM | POA: Diagnosis not present

## 2022-09-16 DIAGNOSIS — E039 Hypothyroidism, unspecified: Secondary | ICD-10-CM | POA: Diagnosis not present

## 2022-09-16 DIAGNOSIS — M810 Age-related osteoporosis without current pathological fracture: Secondary | ICD-10-CM | POA: Diagnosis not present

## 2022-09-16 DIAGNOSIS — E559 Vitamin D deficiency, unspecified: Secondary | ICD-10-CM | POA: Diagnosis not present

## 2022-09-20 ENCOUNTER — Ambulatory Visit
Admission: RE | Admit: 2022-09-20 | Discharge: 2022-09-20 | Disposition: A | Discharge status: Home / Self Care | Payer: Medicare HMO | Source: Ambulatory Visit | Attending: Family Medicine | Admitting: Family Medicine

## 2022-09-20 ENCOUNTER — Other Ambulatory Visit: Payer: Self-pay | Admitting: Family Medicine

## 2022-09-20 DIAGNOSIS — R0609 Other forms of dyspnea: Secondary | ICD-10-CM

## 2022-09-27 DIAGNOSIS — M19072 Primary osteoarthritis, left ankle and foot: Secondary | ICD-10-CM | POA: Diagnosis not present

## 2022-11-02 ENCOUNTER — Ambulatory Visit: Payer: Medicare HMO | Admitting: Cardiology

## 2022-11-02 ENCOUNTER — Encounter: Payer: Self-pay | Admitting: Cardiology

## 2022-11-02 VITALS — BP 149/66 | HR 60 | Resp 16 | Ht 65.0 in | Wt 160.0 lb

## 2022-11-02 DIAGNOSIS — Z789 Other specified health status: Secondary | ICD-10-CM

## 2022-11-02 DIAGNOSIS — R0602 Shortness of breath: Secondary | ICD-10-CM

## 2022-11-02 DIAGNOSIS — E7800 Pure hypercholesterolemia, unspecified: Secondary | ICD-10-CM

## 2022-11-02 NOTE — Progress Notes (Signed)
ID:  Melanie Butler, DOB [birth year], MRN [id number]  PCP:  Laurann Montana, MD  Cardiologist:  Tessa Lerner, DO, [facility] (established care 11/02/22)  REASON FOR CONSULT: Dyspnea on exertion, evaluate for heart disease  REQUESTING PHYSICIAN:  Laurann Montana, MD [id number] [address] [place],  [place] [address]  Chief Complaint  Patient presents with   Eval for DOE   R/O CAD   New Patient (Initial Visit)    HPI  Melanie Butler is a 77 y.o. Caucasian female who presents to the clinic for evaluation of dyspnea on exertion, evaluate for heart disease at the request of Laurann Montana, MD. Her past medical history and cardiovascular risk factors include: Hyperlipidemia, statin intolerance  Patient is referred to practice for evaluation of shortness of breath.  Shortness of breath: Ongoing for the last 7 months. Not worsening in intensity frequency or duration. It can occur with exertion but not always. No anginal chest pain at rest or with effort related activities.  Initially thought it was secondary to asthma but given her age and risk factors cardiology evaluation was requested by PCP. PCP did check a BNP level which was normal limits. She recently has started to walk less than her baseline due to osteoarthritis of the right knee and left ankle.  In the past she was on statin therapy-rosuvastatin and atorvastatin which she states were discontinued due to nightmares.  FUNCTIONAL STATUS: Floor exercises and does her ADLs.    CARDIAC DATABASE: EKG: 11/02/2022: Sinus bradycardia, 56 bpm, without underlying ischemia injury pattern  Echocardiogram: No results found for this or any previous visit from the past 1095 days.   Stress Testing: No results found for this or any previous visit from the past 1095 days.   ALLERGIES: Allergies  Allergen Reactions   Ceclor [Cefaclor] Itching and Swelling   Tetanus Toxoids Rash    "red streaks down arm from injection site" many years ago     MEDICATION LIST PRIOR TO VISIT: Current Meds  Medication Sig   albuterol (PROVENTIL HFA;VENTOLIN HFA) 108 (90 Base) MCG/ACT inhaler Inhale 1-2 puffs into the lungs every 6 (six) hours as needed for wheezing or shortness of breath.   ARNUITY ELLIPTA 100 MCG/ACT AEPB Inhale 1 puff into the lungs daily.   Cholecalciferol (HM VITAMIN D3) 4000 units CAPS Take 4,000 Units by mouth daily.   levothyroxine (SYNTHROID, LEVOTHROID) 150 MCG tablet Take 150 mcg by mouth daily before breakfast.      PAST MEDICAL HISTORY: Past Medical History:  Diagnosis Date   Hyperlipidemia    Thyroid disease     PAST SURGICAL HISTORY: History reviewed. No pertinent surgical history.  FAMILY HISTORY: The patient family history includes Glaucoma in her sister, sister, and sister; Lung cancer in her father; Lupus in her mother; Stroke in her sister.  SOCIAL HISTORY:  The patient  reports that she has never smoked. She has never used smokeless tobacco. She reports that she does not drink alcohol and does not use drugs.  REVIEW OF SYSTEMS: Review of Systems  HENT:         Difficulty w/ hearing  Cardiovascular:  Positive for dyspnea on exertion. Negative for chest pain, claudication, irregular heartbeat, leg swelling, near-syncope, orthopnea, palpitations, paroxysmal nocturnal dyspnea and syncope.  Respiratory:  Positive for shortness of breath.   Hematologic/Lymphatic: Negative for bleeding problem.  Musculoskeletal:  Positive for arthritis and joint pain. Negative for muscle cramps and myalgias.  Neurological:  Negative for dizziness and light-headedness.  PHYSICAL EXAM:    11/02/2022   12:31 PM 10/08/2017    1:30 AM 10/08/2017    1:00 AM  Vitals with BMI  Height 5\' 5"     Weight 160 lbs    BMI 26.63    Systolic 149 131 811  Diastolic 66 73 63  Pulse 60 62 68    Physical Exam  Constitutional: No distress.  Age appropriate, hemodynamically stable.   Neck: No JVD present.  Cardiovascular:  Normal rate, regular rhythm, S1 normal, S2 normal, intact distal pulses and normal pulses. Exam reveals no gallop, no S3 and no S4.  No murmur heard. Pulmonary/Chest: Effort normal and breath sounds normal. No stridor. She has no wheezes. She has no rales.  Abdominal: Soft. Bowel sounds are normal. She exhibits no distension. There is no abdominal tenderness.  Musculoskeletal:        General: No edema.     Cervical back: Neck supple.  Neurological: She is alert and oriented to person, place, and time. She has intact cranial nerves (2-12).  Skin: Skin is warm and moist.   LABORATORY DATA:    Latest Ref Rng & Units 10/08/2017   12:29 AM 10/07/2017    9:02 PM  CBC  WBC 4.0 - 10.5 K/uL 8.6  9.1   Hemoglobin 12.0 - 15.0 g/dL 91.4  78.2   Hematocrit 36.0 - 46.0 % 39.9  41.9   Platelets 150 - 400 K/uL 258  286        Latest Ref Rng & Units 10/07/2017    9:02 PM  CMP  Glucose 70 - 99 mg/dL 97   BUN 8 - 23 mg/dL 12   Creatinine 9.56 - 1.00 mg/dL 2.13   Sodium 086 - 578 mmol/L 139   Potassium 3.5 - 5.1 mmol/L 3.6   Chloride 98 - 111 mmol/L 109   CO2 22 - 32 mmol/L 23   Calcium 8.9 - 10.3 mg/dL 9.0     No results found for: "CHOL", "HDL", "LDLCALC", "LDLDIRECT", "TRIG", "CHOLHDL" No components found for: "NTPROBNP" No results for input(s): "PROBNP" in the last 8760 hours. No results for input(s): "TSH" in the last 8760 hours.  BMP No results for input(s): "NA", "K", "CL", "CO2", "GLUCOSE", "BUN", "CREATININE", "CALCIUM", "GFRNONAA", "GFRAA" in the last 8760 hours.  HEMOGLOBIN A1C No results found for: "HGBA1C", "MPG"  External Labs: Collected: September 16, 2022 provided by PCP BNP 57 Total cholesterol 194, triglycerides 121, HDL 53, LDL calculated 120, non-HDL 142. TSH 2.54. BUN 11, creatinine 0.91 Hemoglobin 13.6 g/dL  IMPRESSION:    [id number]   1. Shortness of breath  R06.02 EKG 12-Lead    PCV ECHOCARDIOGRAM COMPLETE    PCV MYOCARDIAL PERFUSION WITH LEXISCAN    CT CARDIAC  SCORING (SELF PAY ONLY)    2. Pure hypercholesterolemia  E78.00 CT CARDIAC SCORING (SELF PAY ONLY)    3. Statin intolerance  Z78.9        RECOMMENDATIONS: Melanie Butler is a 77 y.o. Caucasian female whose past medical history and cardiac risk factors include: Hyperlipidemia.  Shortness of breath Chronic and progressive. EKG is nonischemic. At times associated with dyspnea on exertion but not always. Blood pressure at today's office visit is not well-controlled-could be contributing to her dyspnea.  I have asked her to keep a log of her blood pressures at home and to review it with me or myself or PCP. Echo will be ordered to evaluate for structural heart disease and left ventricular systolic function. Requesting pharmacological  stress test as the patient has orthopedic issues limiting her ability to exercise and she is at risk of falls.  Nuclear stress test recommended to evaluate for reversible ischemia.  Pure hypercholesterolemia Statin intolerance Most recent LDL 120 mg/dL Has been on rosuvastatin and atorvastatin in the past which was discontinued due to nightmares. Coronary calcium score for further risk stratification.  Data Reviewed: I have independently reviewed external notes provided by the referring provider as part of this office visit.   I have independently reviewed results of labs, EKG as part of medical decision making. I have ordered the following tests:  Orders Placed This Encounter  Procedures   CT CARDIAC SCORING (SELF PAY ONLY)    Standing Status:   Future    Standing Expiration Date:   11/02/2023    Order Specific Question:   Preferred imaging location?    Answer:   External   PCV MYOCARDIAL PERFUSION WITH LEXISCAN    Standing Status:   Future    Standing Expiration Date:   11/02/2023   EKG 12-Lead   PCV ECHOCARDIOGRAM COMPLETE    Standing Status:   Future    Standing Expiration Date:   11/02/2023   I have not made medications changes at today's encounter  as noted above.  FINAL MEDICATION LIST END OF ENCOUNTER: No orders of the defined types were placed in this encounter.   Medications Discontinued During This Encounter  Medication Reason   HYDROcodone-acetaminophen (NORCO/VICODIN) 5-325 MG tablet      Current Outpatient Medications:    albuterol (PROVENTIL HFA;VENTOLIN HFA) 108 (90 Base) MCG/ACT inhaler, Inhale 1-2 puffs into the lungs every 6 (six) hours as needed for wheezing or shortness of breath., Disp: , Rfl:    ARNUITY ELLIPTA 100 MCG/ACT AEPB, Inhale 1 puff into the lungs daily., Disp: , Rfl:    Cholecalciferol (HM VITAMIN D3) 4000 units CAPS, Take 4,000 Units by mouth daily., Disp: , Rfl:    levothyroxine (SYNTHROID, LEVOTHROID) 150 MCG tablet, Take 150 mcg by mouth daily before breakfast. , Disp: , Rfl: 12  Orders Placed This Encounter  Procedures   CT CARDIAC SCORING (SELF PAY ONLY)   PCV MYOCARDIAL PERFUSION WITH LEXISCAN   EKG 12-Lead   PCV ECHOCARDIOGRAM COMPLETE    There are no Patient Instructions on file for this visit.   --Continue cardiac medications as reconciled in final medication list. --Return in about 8 weeks (around 12/28/2022) for Follow up, Dyspnea, Review test results. or sooner if needed. --Continue follow-up with your primary care physician regarding the management of your other chronic comorbid conditions.  Patient's questions and concerns were addressed to her satisfaction. She voices understanding of the instructions provided during this encounter.   This note was created using a voice recognition software as a result there may be grammatical errors inadvertently enclosed that do not reflect the nature of this encounter. Every attempt is made to correct such errors.  Tessa Lerner, [place], [facility]  Pager:  [phone] Office: [phone]

## 2022-11-11 ENCOUNTER — Ambulatory Visit: Payer: Medicare HMO

## 2022-11-11 DIAGNOSIS — R072 Precordial pain: Secondary | ICD-10-CM | POA: Diagnosis not present

## 2022-11-11 DIAGNOSIS — R0602 Shortness of breath: Secondary | ICD-10-CM

## 2022-12-14 ENCOUNTER — Other Ambulatory Visit: Payer: Medicare HMO

## 2022-12-14 ENCOUNTER — Ambulatory Visit (HOSPITAL_COMMUNITY): Payer: Medicare HMO | Attending: Cardiovascular Disease

## 2022-12-14 DIAGNOSIS — R0602 Shortness of breath: Secondary | ICD-10-CM | POA: Insufficient documentation

## 2022-12-14 DIAGNOSIS — J4520 Mild intermittent asthma, uncomplicated: Secondary | ICD-10-CM | POA: Diagnosis not present

## 2022-12-14 DIAGNOSIS — J301 Allergic rhinitis due to pollen: Secondary | ICD-10-CM | POA: Diagnosis not present

## 2022-12-14 DIAGNOSIS — R0609 Other forms of dyspnea: Secondary | ICD-10-CM | POA: Diagnosis not present

## 2022-12-14 DIAGNOSIS — R030 Elevated blood-pressure reading, without diagnosis of hypertension: Secondary | ICD-10-CM | POA: Diagnosis not present

## 2022-12-14 LAB — ECHOCARDIOGRAM COMPLETE
Area-P 1/2: 2.69 cm2
S' Lateral: 3 cm

## 2022-12-27 ENCOUNTER — Encounter: Payer: Self-pay | Admitting: Cardiology

## 2022-12-27 ENCOUNTER — Ambulatory Visit: Payer: Medicare HMO | Attending: Cardiology | Admitting: Cardiology

## 2022-12-27 VITALS — BP 142/72 | HR 60 | Resp 16 | Ht 65.0 in | Wt 161.6 lb

## 2022-12-27 DIAGNOSIS — Z789 Other specified health status: Secondary | ICD-10-CM

## 2022-12-27 DIAGNOSIS — R030 Elevated blood-pressure reading, without diagnosis of hypertension: Secondary | ICD-10-CM | POA: Diagnosis not present

## 2022-12-27 DIAGNOSIS — E7800 Pure hypercholesterolemia, unspecified: Secondary | ICD-10-CM | POA: Diagnosis not present

## 2022-12-27 DIAGNOSIS — R0602 Shortness of breath: Secondary | ICD-10-CM

## 2022-12-27 NOTE — Progress Notes (Signed)
Cardiology Office Note:  .   Date:  12/27/2022  ID:  Melanie Butler, DOB Apr 17, 1945, MRN [id number] PCP:  Laurann Montana, MD  Former Cardiology Providers: None [facility] Providers Cardiologist:  Tessa Lerner, DO , [facility] (established care 10/2022) Electrophysiologist:  None  Click to update primary MD,subspecialty MD or APP then REFRESH:1}    Chief Complaint  Patient presents with   Shortness of Breath   Results   Follow-up    History of Present Illness: [name] is a 77 y.o. Caucasian female whose past medical history and cardiovascular risk factors includes: Hyperlipidemia, statin intolerance.  She was referred to the practice for evaluation of chronic/progressive shortness of breath.   Since last office visit she reports a subjective improvement in symptoms, but is unable to quantify the degree of improvement. She denies chest pain.  She has undergone echo and stress test since last office encounter results reviewed with her in detail and noted below for further reference.  Her blood pressure at today's office visit is not well-controlled.  She forgot to check her blood pressures at home due to personal circumstances, including caring for a husband with medical issues and a sister recently admitted to a nursing home.  Given the fact that she has been intolerant to rosuvastatin and atorvastatin she was recommended to undergo coronary calcium score for further risk stratification but it has  not yet been completed.  Review of Systems: .   Review of Systems  HENT:         Difficulty w/ hearing  Cardiovascular:  Positive for dyspnea on exertion. Negative for chest pain, claudication, irregular heartbeat, leg swelling, near-syncope, orthopnea, palpitations, paroxysmal nocturnal dyspnea and syncope.  Respiratory:  Positive for shortness of breath.   Hematologic/Lymphatic: Negative for bleeding problem.  Musculoskeletal:  Positive for arthritis and joint pain. Negative  for muscle cramps and myalgias.  Neurological:  Negative for dizziness and light-headedness.    Studies Reviewed:   EKG: 11/02/2022: Sinus bradycardia, 56 bpm, without underlying ischemia injury pattern  Echocardiogram: October 2024: LVEF 60-65%, grade 1 diastolic dysfunction, average GLS -20.6%, no significant valvular heart disease, estimated RAP 3 mmHg, see report for additional details  Stress Testing: Regadenoson Nuclear stress test 11/11/2022: Myocardial perfusion is normal. Overall LV systolic function is normal without regional wall motion abnormalities. Stress LV EF: 78%.  Nondiagnostic ECG stress. The heart rate response was consistent with Regadenoson.  No previous exam available for comparison. Low risk.   RADIOLOGY: NA  Risk Assessment/Calculations:   NA   Labs:       Latest Ref Rng & Units 10/08/2017   12:29 AM 10/07/2017    9:02 PM  CBC  WBC 4.0 - 10.5 K/uL 8.6  9.1   Hemoglobin 12.0 - 15.0 g/dL 36.6  44.0   Hematocrit 36.0 - 46.0 % 39.9  41.9   Platelets 150 - 400 K/uL 258  286        Latest Ref Rng & Units 10/07/2017    9:02 PM  BMP  Glucose 70 - 99 mg/dL 97   BUN 8 - 23 mg/dL 12   Creatinine 3.47 - 1.00 mg/dL 4.25   Sodium 956 - 387 mmol/L 139   Potassium 3.5 - 5.1 mmol/L 3.6   Chloride 98 - 111 mmol/L 109   CO2 22 - 32 mmol/L 23   Calcium 8.9 - 10.3 mg/dL 9.0       Latest Ref Rng & Units 10/07/2017  9:02 PM  CMP  Glucose 70 - 99 mg/dL 97   BUN 8 - 23 mg/dL 12   Creatinine 4.09 - 1.00 mg/dL 8.11   Sodium 914 - 782 mmol/L 139   Potassium 3.5 - 5.1 mmol/L 3.6   Chloride 98 - 111 mmol/L 109   CO2 22 - 32 mmol/L 23   Calcium 8.9 - 10.3 mg/dL 9.0     No results found for: "CHOL", "HDL", "LDLCALC", "LDLDIRECT", "TRIG", "CHOLHDL" No results for input(s): "LIPOA" in the last 8760 hours. No components found for: "NTPROBNP" No results for input(s): "PROBNP" in the last 8760 hours. No results for input(s): "TSH" in the last 8760  hours.   Physical Exam:    Today's Vitals   12/27/22 1317  BP: (!) 142/72  Pulse: 60  Resp: 16  SpO2: 97%  Weight: 161 lb 9.6 oz (73.3 kg)  Height: 5\' 5"  (1.651 m)   Body mass index is 26.89 kg/m. Wt Readings from Last 3 Encounters:  12/27/22 161 lb 9.6 oz (73.3 kg)  11/02/22 160 lb (72.6 kg)  10/07/17 165 lb (74.8 kg)    Physical Exam  Constitutional: No distress.  Age appropriate, hemodynamically stable.   Neck: No JVD present.  Cardiovascular: Normal rate, regular rhythm, S1 normal, S2 normal, intact distal pulses and normal pulses. Exam reveals no gallop, no S3 and no S4.  No murmur heard. Pulmonary/Chest: Effort normal and breath sounds normal. No stridor. She has no wheezes. She has no rales.  Abdominal: Soft. Bowel sounds are normal. She exhibits no distension. There is no abdominal tenderness.  Musculoskeletal:        General: No edema.     Cervical back: Neck supple.  Neurological: She is alert and oriented to person, place, and time. She has intact cranial nerves (2-12).  Skin: Skin is warm and moist.     Impression & Recommendation(s):  Impression:   ICD-10-CM   1. Shortness of breath  R06.02     2. Blood pressure elevated without history of HTN  R03.0     3. Pure hypercholesterolemia  E78.00     4. Statin intolerance  Z78.9        Recommendation(s):  Shortness of breath Chronic. Improved since last visit. Echocardiogram: Preserved LVEF, no significant valvular heart disease, grade 1 diastolic dysfunction, see report for additional details Stress test: Low risk study I suspect the degree of her shortness of breath is coming from elevated blood pressures. No additional cardiovascular testing warranted at this time  Blood pressure elevated without history of HTN Office blood pressures remain elevated. She was recommended to check her blood pressures at home-but failed to do so as she has been busy with other family members were sick. We discussed  initiation of antihypertensive medications. Patient states that her PCP also recommended monitoring her blood pressures to consider medical therapy and will follow-up with Dr. Cliffton Asters. Reemphasized importance of low-salt diet  Pure hypercholesterolemia Statin intolerance Most recent LDL 120 mg/dL. Was on rosuvastatin as well as Lipitor-discontinued due to nightmares. Coronary calcium score was ordered at last visit to further risk stratify her-the results are still pending. If she does have coronary artery calcification would recommend lipid-lowering agents.  Patient states that she will get the coronary calcium score done in the following months and follow-up with PCP  Orders Placed:  No orders of the defined types were placed in this encounter.  Final Medication List:   No orders of the defined types were placed  in this encounter.   There are no discontinued medications.   Current Outpatient Medications:    albuterol (PROVENTIL HFA;VENTOLIN HFA) 108 (90 Base) MCG/ACT inhaler, Inhale 1-2 puffs into the lungs every 6 (six) hours as needed for wheezing or shortness of breath., Disp: , Rfl:    ARNUITY ELLIPTA 100 MCG/ACT AEPB, Inhale 1 puff into the lungs daily., Disp: , Rfl:    Cholecalciferol (HM VITAMIN D3) 4000 units CAPS, Take 4,000 Units by mouth daily., Disp: , Rfl:    levothyroxine (SYNTHROID, LEVOTHROID) 150 MCG tablet, Take 150 mcg by mouth daily before breakfast. , Disp: , Rfl: 12  Consent:   NA  Disposition:   1 year for dyspnea - sooner if needed.   Her questions and concerns were addressed to her satisfaction. She voices understanding of the recommendations provided during this encounter.    Signed, Tessa Lerner, DO, [facility]  [facility]  [address] [place], [place] [address] 12/27/2022 1:47 PM

## 2022-12-27 NOTE — Patient Instructions (Signed)

## 2023-01-12 DIAGNOSIS — Z1231 Encounter for screening mammogram for malignant neoplasm of breast: Secondary | ICD-10-CM | POA: Diagnosis not present

## 2023-01-12 DIAGNOSIS — M810 Age-related osteoporosis without current pathological fracture: Secondary | ICD-10-CM | POA: Diagnosis not present

## 2023-01-12 DIAGNOSIS — Z8262 Family history of osteoporosis: Secondary | ICD-10-CM | POA: Diagnosis not present

## 2023-01-12 DIAGNOSIS — M8588 Other specified disorders of bone density and structure, other site: Secondary | ICD-10-CM | POA: Diagnosis not present

## 2023-01-12 DIAGNOSIS — R29890 Loss of height: Secondary | ICD-10-CM | POA: Diagnosis not present

## 2023-02-08 ENCOUNTER — Other Ambulatory Visit (HOSPITAL_COMMUNITY)
Admission: EM | Admit: 2023-02-08 | Discharge: 2023-02-08 | Disposition: A | Discharge status: Home / Self Care | Payer: Medicare HMO | Source: Ambulatory Visit | Attending: *Deleted | Admitting: *Deleted

## 2023-02-08 ENCOUNTER — Ambulatory Visit (HOSPITAL_COMMUNITY): Payer: Medicare HMO

## 2023-02-18 ENCOUNTER — Ambulatory Visit (HOSPITAL_COMMUNITY)
Admission: RE | Admit: 2023-02-18 | Discharge: 2023-02-18 | Disposition: A | Discharge status: Home / Self Care | Payer: Self-pay | Source: Ambulatory Visit | Attending: Cardiology | Admitting: Cardiology

## 2023-02-18 DIAGNOSIS — E7800 Pure hypercholesterolemia, unspecified: Secondary | ICD-10-CM | POA: Insufficient documentation

## 2023-02-18 DIAGNOSIS — R0602 Shortness of breath: Secondary | ICD-10-CM | POA: Insufficient documentation

## 2023-02-24 ENCOUNTER — Other Ambulatory Visit (HOSPITAL_COMMUNITY): Payer: Self-pay

## 2023-02-24 MED ORDER — ROSUVASTATIN CALCIUM 10 MG PO TABS: 10.0000 mg | ORAL_TABLET | Freq: Every evening | ORAL | 3 refills | Status: DC

## 2023-02-24 NOTE — Progress Notes (Signed)
That's fine.  Start Crestor 10 mg po at bedtime.  No grapefruit juice and monitor for myalgia.   Latressa Harries [place], DO, [facility]

## 2023-03-16 DIAGNOSIS — L821 Other seborrheic keratosis: Secondary | ICD-10-CM | POA: Diagnosis not present

## 2023-03-16 DIAGNOSIS — D0439 Carcinoma in situ of skin of other parts of face: Secondary | ICD-10-CM | POA: Diagnosis not present

## 2023-03-16 DIAGNOSIS — L570 Actinic keratosis: Secondary | ICD-10-CM | POA: Diagnosis not present

## 2023-04-04 DIAGNOSIS — H40013 Open angle with borderline findings, low risk, bilateral: Secondary | ICD-10-CM | POA: Diagnosis not present

## 2023-04-04 DIAGNOSIS — H43813 Vitreous degeneration, bilateral: Secondary | ICD-10-CM | POA: Diagnosis not present

## 2023-04-04 DIAGNOSIS — H25813 Combined forms of age-related cataract, bilateral: Secondary | ICD-10-CM | POA: Diagnosis not present

## 2023-05-02 DIAGNOSIS — E785 Hyperlipidemia, unspecified: Secondary | ICD-10-CM | POA: Diagnosis not present

## 2023-05-02 DIAGNOSIS — I700 Atherosclerosis of aorta: Secondary | ICD-10-CM | POA: Diagnosis not present

## 2023-05-02 DIAGNOSIS — R0609 Other forms of dyspnea: Secondary | ICD-10-CM | POA: Diagnosis not present

## 2023-05-02 DIAGNOSIS — J4520 Mild intermittent asthma, uncomplicated: Secondary | ICD-10-CM | POA: Diagnosis not present

## 2023-05-04 ENCOUNTER — Inpatient Hospital Stay (HOSPITAL_COMMUNITY)
Admission: EM | Admit: 2023-05-04 | Discharge: 2023-05-09 | DRG: 522 | Disposition: A | Discharge status: Skilled Nursing Facility | Attending: Family Medicine | Admitting: Family Medicine

## 2023-05-04 ENCOUNTER — Inpatient Hospital Stay (HOSPITAL_COMMUNITY)

## 2023-05-04 ENCOUNTER — Encounter (HOSPITAL_COMMUNITY): Payer: Self-pay

## 2023-05-04 ENCOUNTER — Emergency Department (HOSPITAL_COMMUNITY)

## 2023-05-04 ENCOUNTER — Other Ambulatory Visit: Payer: Self-pay

## 2023-05-04 DIAGNOSIS — K5909 Other constipation: Secondary | ICD-10-CM | POA: Diagnosis not present

## 2023-05-04 DIAGNOSIS — G4709 Other insomnia: Secondary | ICD-10-CM | POA: Diagnosis not present

## 2023-05-04 DIAGNOSIS — J45909 Unspecified asthma, uncomplicated: Secondary | ICD-10-CM | POA: Diagnosis not present

## 2023-05-04 DIAGNOSIS — Z887 Allergy status to serum and vaccine status: Secondary | ICD-10-CM

## 2023-05-04 DIAGNOSIS — Y92019 Unspecified place in single-family (private) house as the place of occurrence of the external cause: Secondary | ICD-10-CM | POA: Diagnosis not present

## 2023-05-04 DIAGNOSIS — R9431 Abnormal electrocardiogram [ECG] [EKG]: Secondary | ICD-10-CM | POA: Diagnosis not present

## 2023-05-04 DIAGNOSIS — Z83511 Family history of glaucoma: Secondary | ICD-10-CM

## 2023-05-04 DIAGNOSIS — D72829 Elevated white blood cell count, unspecified: Secondary | ICD-10-CM | POA: Diagnosis present

## 2023-05-04 DIAGNOSIS — R9389 Abnormal findings on diagnostic imaging of other specified body structures: Secondary | ICD-10-CM | POA: Diagnosis not present

## 2023-05-04 DIAGNOSIS — J439 Emphysema, unspecified: Secondary | ICD-10-CM | POA: Diagnosis present

## 2023-05-04 DIAGNOSIS — E039 Hypothyroidism, unspecified: Secondary | ICD-10-CM | POA: Diagnosis not present

## 2023-05-04 DIAGNOSIS — I878 Other specified disorders of veins: Secondary | ICD-10-CM | POA: Diagnosis not present

## 2023-05-04 DIAGNOSIS — J449 Chronic obstructive pulmonary disease, unspecified: Secondary | ICD-10-CM | POA: Diagnosis not present

## 2023-05-04 DIAGNOSIS — Z881 Allergy status to other antibiotic agents status: Secondary | ICD-10-CM

## 2023-05-04 DIAGNOSIS — E785 Hyperlipidemia, unspecified: Secondary | ICD-10-CM | POA: Diagnosis present

## 2023-05-04 DIAGNOSIS — Z79899 Other long term (current) drug therapy: Secondary | ICD-10-CM | POA: Diagnosis not present

## 2023-05-04 DIAGNOSIS — S72012A Unspecified intracapsular fracture of left femur, initial encounter for closed fracture: Principal | ICD-10-CM | POA: Diagnosis present

## 2023-05-04 DIAGNOSIS — Z8269 Family history of other diseases of the musculoskeletal system and connective tissue: Secondary | ICD-10-CM | POA: Diagnosis not present

## 2023-05-04 DIAGNOSIS — W010XXA Fall on same level from slipping, tripping and stumbling without subsequent striking against object, initial encounter: Secondary | ICD-10-CM | POA: Diagnosis present

## 2023-05-04 DIAGNOSIS — R2689 Other abnormalities of gait and mobility: Secondary | ICD-10-CM | POA: Diagnosis not present

## 2023-05-04 DIAGNOSIS — K219 Gastro-esophageal reflux disease without esophagitis: Secondary | ICD-10-CM | POA: Diagnosis not present

## 2023-05-04 DIAGNOSIS — M25552 Pain in left hip: Secondary | ICD-10-CM | POA: Diagnosis not present

## 2023-05-04 DIAGNOSIS — E569 Vitamin deficiency, unspecified: Secondary | ICD-10-CM | POA: Diagnosis not present

## 2023-05-04 DIAGNOSIS — I10 Essential (primary) hypertension: Secondary | ICD-10-CM | POA: Diagnosis not present

## 2023-05-04 DIAGNOSIS — Z743 Need for continuous supervision: Secondary | ICD-10-CM | POA: Diagnosis not present

## 2023-05-04 DIAGNOSIS — I959 Hypotension, unspecified: Secondary | ICD-10-CM | POA: Diagnosis not present

## 2023-05-04 DIAGNOSIS — S72002A Fracture of unspecified part of neck of left femur, initial encounter for closed fracture: Principal | ICD-10-CM | POA: Diagnosis present

## 2023-05-04 DIAGNOSIS — Z7401 Bed confinement status: Secondary | ICD-10-CM | POA: Diagnosis not present

## 2023-05-04 DIAGNOSIS — Z7951 Long term (current) use of inhaled steroids: Secondary | ICD-10-CM | POA: Diagnosis not present

## 2023-05-04 DIAGNOSIS — Z9181 History of falling: Secondary | ICD-10-CM | POA: Diagnosis not present

## 2023-05-04 DIAGNOSIS — D62 Acute posthemorrhagic anemia: Secondary | ICD-10-CM | POA: Diagnosis not present

## 2023-05-04 DIAGNOSIS — Z96642 Presence of left artificial hip joint: Secondary | ICD-10-CM | POA: Diagnosis not present

## 2023-05-04 DIAGNOSIS — J4520 Mild intermittent asthma, uncomplicated: Secondary | ICD-10-CM | POA: Diagnosis not present

## 2023-05-04 DIAGNOSIS — M62562 Muscle wasting and atrophy, not elsewhere classified, left lower leg: Secondary | ICD-10-CM | POA: Diagnosis not present

## 2023-05-04 DIAGNOSIS — R41841 Cognitive communication deficit: Secondary | ICD-10-CM | POA: Diagnosis not present

## 2023-05-04 DIAGNOSIS — Z823 Family history of stroke: Secondary | ICD-10-CM | POA: Diagnosis not present

## 2023-05-04 DIAGNOSIS — Z79890 Hormone replacement therapy (postmenopausal): Secondary | ICD-10-CM | POA: Diagnosis not present

## 2023-05-04 DIAGNOSIS — S72002D Fracture of unspecified part of neck of left femur, subsequent encounter for closed fracture with routine healing: Secondary | ICD-10-CM | POA: Diagnosis not present

## 2023-05-04 DIAGNOSIS — Z801 Family history of malignant neoplasm of trachea, bronchus and lung: Secondary | ICD-10-CM

## 2023-05-04 DIAGNOSIS — R531 Weakness: Secondary | ICD-10-CM | POA: Diagnosis not present

## 2023-05-04 LAB — CBC WITH DIFFERENTIAL/PLATELET
Abs Immature Granulocytes: 0.1 10*3/uL — ABNORMAL HIGH (ref 0.00–0.07)
Basophils Absolute: 0 10*3/uL (ref 0.0–0.1)
Basophils Relative: 0 %
Eosinophils Absolute: 0.1 10*3/uL (ref 0.0–0.5)
Eosinophils Relative: 0 %
HCT: 41 % (ref 36.0–46.0)
Hemoglobin: 13.7 g/dL (ref 12.0–15.0)
Immature Granulocytes: 1 %
Lymphocytes Relative: 10 %
Lymphs Abs: 1.7 10*3/uL (ref 0.7–4.0)
MCH: 30.4 pg (ref 26.0–34.0)
MCHC: 33.4 g/dL (ref 30.0–36.0)
MCV: 90.9 fL (ref 80.0–100.0)
Monocytes Absolute: 0.7 10*3/uL (ref 0.1–1.0)
Monocytes Relative: 4 %
Neutro Abs: 14.4 10*3/uL — ABNORMAL HIGH (ref 1.7–7.7)
Neutrophils Relative %: 85 %
Platelets: 248 10*3/uL (ref 150–400)
RBC: 4.51 MIL/uL (ref 3.87–5.11)
RDW: 13.4 % (ref 11.5–15.5)
WBC: 17 10*3/uL — ABNORMAL HIGH (ref 4.0–10.5)
nRBC: 0 % (ref 0.0–0.2)

## 2023-05-04 LAB — COMPREHENSIVE METABOLIC PANEL
ALT: 26 U/L (ref 0–44)
AST: 26 U/L (ref 15–41)
Albumin: 3.9 g/dL (ref 3.5–5.0)
Alkaline Phosphatase: 83 U/L (ref 38–126)
Anion gap: 11 (ref 5–15)
BUN: 21 mg/dL (ref 8–23)
CO2: 22 mmol/L (ref 22–32)
Calcium: 9.6 mg/dL (ref 8.9–10.3)
Chloride: 103 mmol/L (ref 98–111)
Creatinine, Ser: 0.95 mg/dL (ref 0.44–1.00)
GFR, Estimated: >60 mL/min (ref >60)
Glucose, Bld: 123 mg/dL — ABNORMAL HIGH (ref 70–99)
Potassium: 3.5 mmol/L (ref 3.5–5.1)
Sodium: 136 mmol/L (ref 135–145)
Total Bilirubin: 0.8 mg/dL (ref 0.0–1.2)
Total Protein: 6.5 g/dL (ref 6.5–8.1)

## 2023-05-04 LAB — TSH: TSH: 0.855 u[IU]/mL (ref 0.350–4.500)

## 2023-05-04 MED ORDER — LEVOTHYROXINE SODIUM 50 MCG PO TABS: 150.0000 ug | ORAL_TABLET | Freq: Every day | ORAL | Status: DC

## 2023-05-04 MED ORDER — FENTANYL CITRATE PF 50 MCG/ML IJ SOSY
25.0000 ug | PREFILLED_SYRINGE | INTRAMUSCULAR | Status: DC | PRN
  Administered 2023-05-04 – 2023-05-05 (×7): 25 ug via INTRAVENOUS
  Filled 2023-05-04 (×3): qty 1

## 2023-05-04 MED ORDER — VITAMIN D 25 MCG (1000 UNIT) PO TABS
4000.0000 [IU] | ORAL_TABLET | Freq: Every day | ORAL | Status: DC
  Administered 2023-05-04 – 2023-05-09 (×5): 4000 [IU] via ORAL
  Filled 2023-05-04 (×5): qty 4

## 2023-05-04 MED ORDER — ONDANSETRON HCL 4 MG/2ML IJ SOLN: 4.0000 mg | Freq: Four times a day (QID) | INTRAMUSCULAR | Status: DC | PRN

## 2023-05-04 MED ORDER — BUDESONIDE 0.25 MG/2ML IN SUSP
0.2500 mg | Freq: Two times a day (BID) | RESPIRATORY_TRACT | Status: DC
  Administered 2023-05-04 – 2023-05-09 (×10): 0.25 mg via RESPIRATORY_TRACT
  Filled 2023-05-04 (×10): qty 2

## 2023-05-04 MED ORDER — FENTANYL CITRATE PF 50 MCG/ML IJ SOSY: 50.0000 ug | PREFILLED_SYRINGE | Freq: Once | INTRAMUSCULAR | Status: DC

## 2023-05-04 MED ORDER — SODIUM CHLORIDE 0.9% FLUSH
3.0000 mL | Freq: Two times a day (BID) | INTRAVENOUS | Status: DC
  Administered 2023-05-04 – 2023-05-08 (×6): 3 mL via INTRAVENOUS

## 2023-05-04 MED ORDER — SENNOSIDES-DOCUSATE SODIUM 8.6-50 MG PO TABS
2.0000 | ORAL_TABLET | Freq: Every day | ORAL | Status: DC
  Administered 2023-05-04 – 2023-05-08 (×5): 2 via ORAL
  Filled 2023-05-04 (×5): qty 2

## 2023-05-04 MED ORDER — CEFAZOLIN SODIUM-DEXTROSE 2-4 GM/100ML-% IV SOLN
2.0000 g | INTRAVENOUS | Status: AC
  Administered 2023-05-05: 2 g via INTRAVENOUS
  Filled 2023-05-04: qty 100

## 2023-05-04 MED ORDER — BISACODYL 10 MG RE SUPP: 10.0000 mg | Freq: Every day | RECTAL | Status: DC | PRN

## 2023-05-04 MED ORDER — ACETAMINOPHEN 325 MG PO TABS
650.0000 mg | ORAL_TABLET | Freq: Four times a day (QID) | ORAL | Status: DC | PRN
  Administered 2023-05-07 – 2023-05-09 (×4): 650 mg via ORAL
  Filled 2023-05-04 (×4): qty 2

## 2023-05-04 MED ORDER — SODIUM CHLORIDE 0.9% FLUSH: 3.0000 mL | INTRAVENOUS | Status: DC | PRN

## 2023-05-04 MED ORDER — UMECLIDINIUM BROMIDE 62.5 MCG/ACT IN AEPB
1.0000 | INHALATION_SPRAY | Freq: Every day | RESPIRATORY_TRACT | Status: DC
  Administered 2023-05-06 – 2023-05-09 (×4): 1 via RESPIRATORY_TRACT
  Filled 2023-05-04: qty 7

## 2023-05-04 MED ORDER — ALBUTEROL SULFATE (2.5 MG/3ML) 0.083% IN NEBU: 2.5000 mg | INHALATION_SOLUTION | RESPIRATORY_TRACT | Status: DC | PRN

## 2023-05-04 MED ORDER — FENTANYL CITRATE PF 50 MCG/ML IJ SOSY
50.0000 ug | PREFILLED_SYRINGE | Freq: Once | INTRAMUSCULAR | Status: AC
  Administered 2023-05-04: 50 ug via INTRAVENOUS
  Filled 2023-05-04: qty 1

## 2023-05-04 MED ORDER — METHOCARBAMOL 500 MG PO TABS
750.0000 mg | ORAL_TABLET | Freq: Three times a day (TID) | ORAL | Status: AC
  Administered 2023-05-04 – 2023-05-06 (×6): 750 mg via ORAL
  Filled 2023-05-04 (×6): qty 2

## 2023-05-04 MED ORDER — TRAZODONE HCL 50 MG PO TABS
50.0000 mg | ORAL_TABLET | Freq: Every evening | ORAL | Status: DC | PRN
  Administered 2023-05-06: 50 mg via ORAL
  Filled 2023-05-04: qty 1

## 2023-05-04 MED ORDER — POLYETHYLENE GLYCOL 3350 17 G PO PACK: 17.0000 g | PACK | Freq: Every day | ORAL | Status: DC | PRN

## 2023-05-04 MED ORDER — SODIUM CHLORIDE 0.9 % IV SOLN: INTRAVENOUS | Status: AC | PRN

## 2023-05-04 MED ORDER — FLUTICASONE FUROATE 100 MCG/ACT IN AEPB: 1.0000 | INHALATION_SPRAY | Freq: Every day | RESPIRATORY_TRACT | Status: DC

## 2023-05-04 MED ORDER — SODIUM CHLORIDE 0.9% FLUSH
3.0000 mL | Freq: Two times a day (BID) | INTRAVENOUS | Status: DC
  Administered 2023-05-04 – 2023-05-09 (×9): 3 mL via INTRAVENOUS

## 2023-05-04 MED ORDER — HEPARIN SODIUM (PORCINE) 5000 UNIT/ML IJ SOLN
5000.0000 [IU] | Freq: Three times a day (TID) | INTRAMUSCULAR | Status: DC
  Administered 2023-05-06 – 2023-05-09 (×8): 5000 [IU] via SUBCUTANEOUS
  Filled 2023-05-04 (×8): qty 1

## 2023-05-04 MED ORDER — OXYCODONE HCL 5 MG PO TABS
5.0000 mg | ORAL_TABLET | ORAL | Status: DC | PRN
  Administered 2023-05-04 – 2023-05-09 (×19): 5 mg via ORAL
  Filled 2023-05-04 (×20): qty 1

## 2023-05-04 MED ORDER — ONDANSETRON HCL 4 MG PO TABS: 4.0000 mg | ORAL_TABLET | Freq: Four times a day (QID) | ORAL | Status: DC | PRN

## 2023-05-04 MED ORDER — HEPARIN SODIUM (PORCINE) 5000 UNIT/ML IJ SOLN: 5000.0000 [IU] | Freq: Three times a day (TID) | INTRAMUSCULAR | Status: DC

## 2023-05-04 MED ORDER — TRANEXAMIC ACID-NACL 1000-0.7 MG/100ML-% IV SOLN
1000.0000 mg | INTRAVENOUS | Status: AC
  Administered 2023-05-05: 1000 mg via INTRAVENOUS
  Filled 2023-05-04: qty 100

## 2023-05-04 MED ORDER — ACETAMINOPHEN 650 MG RE SUPP: 650.0000 mg | Freq: Four times a day (QID) | RECTAL | Status: DC | PRN

## 2023-05-04 MED ORDER — LEVOTHYROXINE SODIUM 137 MCG PO TABS
137.0000 ug | ORAL_TABLET | Freq: Every day | ORAL | Status: DC
  Administered 2023-05-05 – 2023-05-09 (×5): 137 ug via ORAL
  Filled 2023-05-04 (×5): qty 1

## 2023-05-04 NOTE — Consult Note (Signed)
 ORTHOPAEDIC CONSULTATION  REQUESTING PHYSICIAN: Shon Hale, MD  ASSESSMENT AND PLAN: 78 y.o. female with the following: Left Hip Displaced femoral neck fracture  This patient requires inpatient admission to the hospitalist, to include preoperative clearance and perioperative medical management  - Weight Bearing Status/Activity: NWB Left lower extremity  - Additional recommended labs/tests: Preop Labs: CBC, BMP, PT/INR, Chest XR, and EKG  -VTE Prophylaxis: Please hold prior to OR; to resume POD#1 at the discretion of the primary team  - Pain control: Recommend PO pain medications PRN; judicious use of narcotics  - Follow-up plan: F/u 10-14 days postop  -Procedures: Plan for OR once patient has been medically optimized  Plan for Left Hip hemiarthroplasty  Surgery plan for 05/05/2023.  Please make sure she is n.p.o. at midnight, prior to surgery.     Chief Complaint: Left hip pain  HPI: Melanie Butler is a 78 y.o. female who presented to the ED for evaluation after sustaining a mechanical fall.  Patient reportedly slipped on a rug at home, and fell onto her left side.  She had immediate pain.  She was able to get herself off the floor, but unable to ambulate.  She presented to the emergency department via EMS.  She has pain with motion.  She is comfortable when she is in a steady position.  Pain medicines have been helpful.  Prior to this, she was not using a walker.  She denies numbness and tingling.  She did not hit her head.    Past Medical History:  Diagnosis Date   Hyperlipidemia    Thyroid disease    History reviewed. No pertinent surgical history. Social History   Socioeconomic History   Marital status: Married    Spouse name: Not on file   Number of children: 1   Years of education: Not on file   Highest education level: Not on file  Occupational History   Not on file  Tobacco Use   Smoking status: Never   Smokeless tobacco: Never  Substance and Sexual  Activity   Alcohol use: Never   Drug use: Never   Sexual activity: Not on file  Other Topics Concern   Not on file  Social History Narrative   Not on file   Social Drivers of Health   Financial Resource Strain: Not on file  Food Insecurity: No Food Insecurity (05/04/2023)   Hunger Vital Sign    Worried About Running Out of Food in the Last Year: Never true    Ran Out of Food in the Last Year: Never true  Transportation Needs: No Transportation Needs (05/04/2023)   PRAPARE - Administrator, Civil Service (Medical): No    Lack of Transportation (Non-Medical): No  Physical Activity: Not on file  Stress: Not on file  Social Connections: Socially Integrated (05/04/2023)   Social Connection and Isolation Panel [NHANES]    Frequency of Communication with Friends and Family: Three times a week    Frequency of Social Gatherings with Friends and Family: Three times a week    Attends Religious Services: More than 4 times per year    Active Member of Clubs or Organizations: Yes    Attends Banker Meetings: 1 to 4 times per year    Marital Status: Married   Family History  Problem Relation Age of Onset   Lupus Mother    Lung cancer Father    Glaucoma Sister    Stroke Sister    Glaucoma Sister  Glaucoma Sister    Allergies  Allergen Reactions   Ceclor [Cefaclor] Itching and Swelling   Tetanus Toxoids Rash    "red streaks down arm from injection site" many years ago   Prior to Admission medications   Medication Sig Start Date End Date Taking? Authorizing Provider  albuterol (PROVENTIL HFA;VENTOLIN HFA) 108 (90 Base) MCG/ACT inhaler Inhale 1-2 puffs into the lungs every 6 (six) hours as needed for wheezing or shortness of breath.   Yes [provider]  ARNUITY ELLIPTA 100 MCG/ACT AEPB Inhale 1 puff into the lungs daily. 10/18/22  Yes [provider]  Cholecalciferol (HM VITAMIN D3) 4000 units CAPS Take 4,000 Units by mouth daily.   Yes [provider]  levothyroxine (SYNTHROID) 137 MCG tablet Take 137 mcg by mouth every morning.   Yes [provider]  omega-3 acid ethyl esters (LOVAZA) 1 g capsule Take 1 g by mouth 2 (two) times daily.   Yes [provider]  rosuvastatin (CRESTOR) 10 MG tablet Take 1 tablet (10 mg total) by mouth at bedtime. art Crestor 10 mg po at bedtime. No grapefruit juice 02/24/23 05/25/23 Yes Tessa Lerner, DO   DG CHEST PORT 1 VIEW Result Date: 05/04/2023 CLINICAL DATA:  [id number].  Left hip fracture. [id number].  Preoperative testing prior to surgery. EXAM: PORTABLE CHEST 1 VIEW COMPARISON:  Partial chest CT for coronary calcium scoring 02/18/2023, PA and lateral chest 09/20/2022. FINDINGS: The lungs are slightly emphysematous but clear aside from linear scarring or atelectasis along the left heart border. There is mild elevation of the right hemidiaphragm. No pleural effusion is seen. The cardiomediastinal silhouette is normal. There is osteopenia and thoracic spondylosis. There are calcified left axillary lymph nodes. Artifacts from overlying clothing. IMPRESSION: 1. No evidence of acute chest disease. Emphysema. 2. Osteopenia and thoracic spondylosis. Electronically Signed   By: Almira Bar M.D.   On: 05/04/2023 07:26   DG Hip Unilat W or Wo Pelvis 2-3 Views Left Result Date: 05/04/2023 CLINICAL DATA:  Fall with left hip pain. EXAM: DG HIP (WITH OR WITHOUT PELVIS) 2-3V LEFT COMPARISON:  None Available. FINDINGS: Three views including AP pelvis. There is an impacted and otherwise nondisplaced subcapital transverse acute fracture of the proximal left femur. There is no dislocation. No pelvic fracture or diastasis. The bilateral hip joint spaces are maintained. The SI joints and symphysis pubis are unremarkable, as visualized. The proximal right femur intact AP. There are calcified fibroids in the pelvis. Pelvic phleboliths. Artifacts from overlying clothing. IMPRESSION: Impacted and otherwise  nondisplaced subcapital transverse fracture of the proximal left femur. Electronically Signed   By: Almira Bar M.D.   On: 05/04/2023 04:43   Family History Reviewed and non-contributory, no pertinent history of problems with bleeding or anesthesia    Review of Systems No fevers or chills No numbness or tingling No chest pain No shortness of breath No bowel or bladder dysfunction No GI distress No headaches    OBJECTIVE  Vitals:Patient Vitals for the past 8 hrs:  BP Temp Temp src Pulse Resp SpO2 Height Weight  05/04/23 1240 -- -- -- -- -- 97 % -- --  05/04/23 0947 (!) 147/76 98.7 F (37.1 C) Oral 75 18 96 % 5\' 3"  (1.6 m) 72.6 kg   General: Alert, no acute distress Cardiovascular: Extremities are warm Respiratory: No cyanosis, no use of accessory musculature Skin: No lesions in the area of chief complaint  Neurologic: Sensation intact distally  Psychiatric: Patient is competent for consent with  normal mood and affect Lymphatic: No swelling obvious and reported other than the area involved in the exam below Extremities  LLE: Extremity held in a fixed position.  ROM deferred due to known fracture.  Sensation is intact distally in the sural, saphenous, DP, SP, and plantar nerve distribution. 2+ DP pulse.  Toes are WWP.  Active motion intact in the TA/EHL/GS. RLE: Sensation is intact distally in the sural, saphenous, DP, SP, and plantar nerve distribution. 2+ DP pulse.  Toes are WWP.  Active motion intact in the TA/EHL/GS. Tolerates gentle ROM of the hip.  No pain with axial loading.     Test Results Imaging XR of the Left hip demonstrates a Displaced femoral neck fracture.  There is impaction of femoral neck fracture, with mild angulation.  Labs cbc Recent Labs    05/04/23 0451  WBC 17.0*  HGB 13.7  HCT 41.0  PLT 248     Recent Labs    05/04/23 0451  NA 136  K 3.5  CL 103  CO2 22  GLUCOSE 123*  BUN 21  CREATININE 0.95  CALCIUM 9.6

## 2023-05-04 NOTE — Plan of Care (Signed)
   Problem: Education: Goal: Knowledge of General Education information will improve Description Including pain rating scale, medication(s)/side effects and non-pharmacologic comfort measures Outcome: Progressing   Problem: Health Behavior/Discharge Planning: Goal: Ability to manage health-related needs will improve Outcome: Progressing

## 2023-05-04 NOTE — ED Triage Notes (Signed)
 Pt fell tonight on rug and now c/o left hip pain. Not able to bear weight on hip. Pt is not on any blood thinners.

## 2023-05-04 NOTE — ED Provider Notes (Addendum)
 [place] EMERGENCY DEPARTMENT AT [facility] Provider Note  CSN: [id number] Arrival date & time: 05/04/23 7829  Chief Complaint(s) Fall and Hip Pain  HPI Melanie Butler is a 78 y.o. female with PMH HLD who presents emergency room for evaluation of a fall with hip pain.  History obtained from patient's family who states that the patient slipped on a rug and landed on her left hip.  Unable to bear weight or move the leg secondary to pain.  Denies numbness, tingling, weakness of lower extremity.  Denies head strike or loss of consciousness.  Denies any additional traumatic complaints including chest pain, shortness of breath, Donnell pain, nausea, vomiting.    Past Medical History Past Medical History:  Diagnosis Date   Hyperlipidemia    Thyroid disease    Patient Active Problem List   Diagnosis Date Noted   Closed left hip fracture, initial encounter (HCC) 05/04/2023   Home Medication(s) Prior to Admission medications   Medication Sig Start Date End Date Taking? Authorizing Provider  albuterol (PROVENTIL HFA;VENTOLIN HFA) 108 (90 Base) MCG/ACT inhaler Inhale 1-2 puffs into the lungs every 6 (six) hours as needed for wheezing or shortness of breath.    [provider]  ARNUITY ELLIPTA 100 MCG/ACT AEPB Inhale 1 puff into the lungs daily. 10/18/22   [provider]  Cholecalciferol (HM VITAMIN D3) 4000 units CAPS Take 4,000 Units by mouth daily.    [provider]  levothyroxine (SYNTHROID, LEVOTHROID) 150 MCG tablet Take 150 mcg by mouth daily before breakfast.  10/03/17   [provider]  rosuvastatin (CRESTOR) 10 MG tablet Take 1 tablet (10 mg total) by mouth at bedtime. art Crestor 10 mg po at bedtime. No grapefruit juice 02/24/23 05/25/23  Tessa Lerner, DO                                                                                                                                    Past Surgical History History reviewed. No pertinent  surgical history. Family History Family History  Problem Relation Age of Onset   Lupus Mother    Lung cancer Father    Glaucoma Sister    Stroke Sister    Glaucoma Sister    Glaucoma Sister     Social History Social History   Tobacco Use   Smoking status: Never   Smokeless tobacco: Never  Substance Use Topics   Alcohol use: Never   Drug use: Never   Allergies Ceclor [cefaclor] and Tetanus toxoids  Review of Systems Review of Systems  Musculoskeletal:  Positive for arthralgias and myalgias.    Physical Exam Vital Signs  I have reviewed the triage vital signs BP 136/76   Pulse 70   Temp 97.9 F (36.6 C) (Oral)   Resp 16   SpO2 95%   Physical Exam Vitals and nursing note reviewed.  Constitutional:      General: She is  not in acute distress.    Appearance: She is well-developed.  HENT:     Head: Normocephalic and atraumatic.  Eyes:     Conjunctiva/sclera: Conjunctivae normal.  Pulmonary:     Effort: Pulmonary effort is normal. No respiratory distress.  Musculoskeletal:        General: Tenderness present. No swelling.     Cervical back: Neck supple.  Skin:    General: Skin is warm and dry.     Capillary Refill: Capillary refill takes less than 2 seconds.  Neurological:     Mental Status: She is alert.  Psychiatric:        Mood and Affect: Mood normal.     ED Results and Treatments Labs (all labs ordered are listed, but only abnormal results are displayed) Labs Reviewed  COMPREHENSIVE METABOLIC PANEL - Abnormal; Notable for the following components:      Result Value   Glucose, Bld 123 (*)    All other components within normal limits  CBC WITH DIFFERENTIAL/PLATELET - Abnormal; Notable for the following components:   WBC 17.0 (*)    Neutro Abs 14.4 (*)    Abs Immature Granulocytes 0.10 (*)    All other components within normal limits                                                                                                                           Radiology DG Hip Unilat W or Wo Pelvis 2-3 Views Left Result Date: 05/04/2023 CLINICAL DATA:  Fall with left hip pain. EXAM: DG HIP (WITH OR WITHOUT PELVIS) 2-3V LEFT COMPARISON:  None Available. FINDINGS: Three views including AP pelvis. There is an impacted and otherwise nondisplaced subcapital transverse acute fracture of the proximal left femur. There is no dislocation. No pelvic fracture or diastasis. The bilateral hip joint spaces are maintained. The SI joints and symphysis pubis are unremarkable, as visualized. The proximal right femur intact AP. There are calcified fibroids in the pelvis. Pelvic phleboliths. Artifacts from overlying clothing. IMPRESSION: Impacted and otherwise nondisplaced subcapital transverse fracture of the proximal left femur. Electronically Signed   By: Almira Bar M.D.   On: 05/04/2023 04:43    Pertinent labs & imaging results that were available during my care of the patient were reviewed by me and considered in my medical decision making (see MDM for details).  Medications Ordered in ED Medications  oxyCODONE (Oxy IR/ROXICODONE) immediate release tablet 5 mg (has no administration in time range)  fentaNYL (SUBLIMAZE) injection 50 mcg (50 mcg Intravenous Given 05/04/23 0450)  Procedures Procedures  (including critical care time)  Medical Decision Making / ED Course   This patient presents to the ED for concern of fall, hip pain, this involves an extensive number of treatment options, and is a complaint that carries with it a high risk of complications and morbidity.  The differential diagnosis includes fracture, dislocation, hematoma, ligamentous injury  MDM: Patient seen emergency room for evaluation of fall with hip pain.  Physical exam reveals a deformity to the left hip with external rotation and shortening of the limb.   Neurologic exam is unremarkable.  Laboratory evaluation with leukocytosis 17.0 likely stress margination in the setting of significant hip pain.  X-ray concerning for subcapital femoral neck fracture on the left.  Sent a message to orthopedic surgeon on-call Dr. Dallas Schimke is recommending medical admission for surgical fixation Will require hospital admission for surgical fixation.  Patient admitted   Additional history obtained: -Additional history obtained from multiple family members -External records from outside source obtained and reviewed including: Chart review including previous notes, labs, imaging, consultation notes   Lab Tests: -I ordered, reviewed, and interpreted labs.   The pertinent results include:   Labs Reviewed  COMPREHENSIVE METABOLIC PANEL - Abnormal; Notable for the following components:      Result Value   Glucose, Bld 123 (*)    All other components within normal limits  CBC WITH DIFFERENTIAL/PLATELET - Abnormal; Notable for the following components:   WBC 17.0 (*)    Neutro Abs 14.4 (*)    Abs Immature Granulocytes 0.10 (*)    All other components within normal limits     Imaging Studies ordered: I ordered imaging studies including hip x-ray I independently visualized and interpreted imaging. I agree with the radiologist interpretation   Medicines ordered and prescription drug management: Meds ordered this encounter  Medications   DISCONTD: fentaNYL (SUBLIMAZE) injection 50 mcg   DISCONTD: fentaNYL (SUBLIMAZE) injection 50 mcg   fentaNYL (SUBLIMAZE) injection 50 mcg   oxyCODONE (Oxy IR/ROXICODONE) immediate release tablet 5 mg    Refill:  0    -I have reviewed the patients home medicines and have made adjustments as needed  Critical interventions none  Consultations Obtained: I requested consultation with the orthopedic surgeon on-call Dr. Dallas Schimke,  and discussed lab and imaging findings as well as pertinent plan - they recommend: Medical  admission   Cardiac Monitoring: The patient was maintained on a cardiac monitor.  I personally viewed and interpreted the cardiac monitored which showed an underlying rhythm of: NSR  Social Determinants of Health:  Factors impacting patients care include: none   Reevaluation: After the interventions noted above, I reevaluated the patient and found that they have :improved  Co morbidities that complicate the patient evaluation  Past Medical History:  Diagnosis Date   Hyperlipidemia    Thyroid disease       Dispostion: I considered admission for this patient, and patient will require hospital admission for hip fracture     Final Clinical Impression(s) / ED Diagnoses Final diagnoses:  Closed fracture of left hip, initial encounter [facility])     @PCDICTATION @    Glendora Score, MD 05/04/23 0604    Glendora Score, MD 05/04/23 [phone]

## 2023-05-04 NOTE — H&P (Signed)
 History and Physical    Patient: Melanie Butler ZOX:[id number] DOB: [birth year] DOA: 05/04/2023 DOS: the patient was seen and examined on 05/04/2023 PCP: Laurann Montana, MD  Patient coming from: Home  Chief Complaint:  Chief Complaint  Patient presents with   Fall   Hip Pain   HPI: Melanie Butler is a 78 y.o. female with medical history significant for chronic asthma, HLD and hypothyroidism who presents from home with left hip pain after mechanical fall from tripping over a rug -Additional history obtained from patient's son Kathlene November at bedside -No chest pains no palpitations no head injury no loss of consciousness or dizziness before or after fall -No fever  Or chills   No Nausea, Vomiting or Diarrhea -- In the ED hip x-rays with Impacted and otherwise nondisplaced subcapital transverse fracture of the proximal left femur. -Chest x-ray with emphysema -CBC with a white count of 17, hemoglobin 13.7 and platelets 248 -With a creatinine of 0.95, LFTs are not elevated, +3.5 glucose 123  Review of Systems: As mentioned in the history of present illness. All other systems reviewed and are negative. Past Medical History:  Diagnosis Date   Hyperlipidemia    Thyroid disease    History reviewed. No pertinent surgical history. Social History:  reports that she has never smoked. She has never used smokeless tobacco. She reports that she does not drink alcohol and does not use drugs.  Allergies  Allergen Reactions   Ceclor [Cefaclor] Itching and Swelling   Tetanus Toxoids Rash    "red streaks down arm from injection site" many years ago    Family History  Problem Relation Age of Onset   Lupus Mother    Lung cancer Father    Glaucoma Sister    Stroke Sister    Glaucoma Sister    Glaucoma Sister     Prior to Admission medications   Medication Sig Start Date End Date Taking? Authorizing Provider  albuterol (PROVENTIL HFA;VENTOLIN HFA) 108 (90 Base) MCG/ACT inhaler Inhale 1-2 puffs into  the lungs every 6 (six) hours as needed for wheezing or shortness of breath.   Yes [provider]  ARNUITY ELLIPTA 100 MCG/ACT AEPB Inhale 1 puff into the lungs daily. 10/18/22  Yes [provider]  Cholecalciferol (HM VITAMIN D3) 4000 units CAPS Take 4,000 Units by mouth daily.   Yes [provider]  levothyroxine (SYNTHROID) 137 MCG tablet Take 137 mcg by mouth every morning.   Yes [provider]  omega-3 acid ethyl esters (LOVAZA) 1 g capsule Take 1 g by mouth 2 (two) times daily.   Yes [provider]  rosuvastatin (CRESTOR) 10 MG tablet Take 1 tablet (10 mg total) by mouth at bedtime. art Crestor 10 mg po at bedtime. No grapefruit juice 02/24/23 05/25/23 Yes Tolia, Sunit, DO    Physical Exam: Vitals:   05/04/23 1500 05/04/23 1600 05/04/23 1615 05/04/23 1654  BP: 137/60 132/65 (!) 150/78 132/63  Pulse: 71 67 70 73  Resp: 17 19 16 20   Temp:    97.7 F (36.5 C)  TempSrc:    Oral  SpO2: 97% 97% 97% 93%  Weight:    72 kg  Height:    5\' 3"  (1.6 m)    Physical Exam Gen:- Awake Alert, in no acute distress  HEENT:- [place].AT, No sclera icterus Nose-  2L/min Neck-Supple Neck,No JVD,.  Lungs-no wheezing, no rhonchi, fair air movement bilaterally  CV- S1, S2 normal, RRR Abd-  +ve B.Sounds, Abd Soft, No  tenderness,    Extremity/Skin:- No  edema,   good pedal pulses  Psych-affect is appropriate, oriented x3 Neuro-no new focal deficits, no tremors MSK-left hip with point tenderness, left lower extremity shortened and rotated  Data Reviewed: In the ED hip x-rays with Impacted and otherwise nondisplaced subcapital transverse fracture of the proximal left femur. -Chest x-ray with emphysema -CBC with a white count of 17, hemoglobin 13.7 and platelets 248 -With a creatinine of 0.95, LFTs are not elevated, +3.5 glucose 123  Assessment and Plan: 1)LT Hip Fx  In the ED hip x-rays with Impacted and otherwise nondisplaced subcapital transverse  fracture of the proximal left femur. -Orthopedic consult appreciated =-Plans for surgical fixation on 05/05/2023 -N.p.o. after midnight -IV fentanyl and oral oxycodone for pain control along with methocarbamol  2) asthma/emphysema--- no acute exacerbation at this time  -c/n bronchodilators  3)HLD-statin due to trauma/hip fracture with risk for elevated CKs  4) hypothyroidism--continue levothyroxine    Advance Care Planning:   Code Status: Full Code   Consults: Orthopedics  Family Communication:  son Kathlene November at bedside  Severity of Illness: The appropriate patient status for this patient is INPATIENT. Inpatient status is judged to be reasonable and necessary in order to provide the required intensity of service to ensure the patient's safety. The patient's presenting symptoms, physical exam findings, and initial radiographic and laboratory data in the context of their chronic comorbidities is felt to place them at high risk for further clinical deterioration. Furthermore, it is not anticipated that the patient will be medically stable for discharge from the hospital within 2 midnights of admission.   * I certify that at the point of admission it is my clinical judgment that the patient will require inpatient hospital care spanning beyond 2 midnights from the point of admission due to high intensity of service, high risk for further deterioration and high frequency of surveillance required.*  Author: Shon Hale, MD 05/04/2023 6:45 PM  For on call review [email or web address].

## 2023-05-05 ENCOUNTER — Encounter (HOSPITAL_COMMUNITY)
Admission: EM | Disposition: A | Discharge status: Skilled Nursing Facility | Payer: Self-pay | Source: Home / Self Care | Attending: Family Medicine

## 2023-05-05 ENCOUNTER — Encounter (HOSPITAL_COMMUNITY): Payer: Self-pay | Admitting: Family Medicine

## 2023-05-05 ENCOUNTER — Inpatient Hospital Stay (HOSPITAL_COMMUNITY): Payer: Self-pay | Admitting: Certified Registered Nurse Anesthetist

## 2023-05-05 ENCOUNTER — Inpatient Hospital Stay (HOSPITAL_COMMUNITY)

## 2023-05-05 DIAGNOSIS — S72002A Fracture of unspecified part of neck of left femur, initial encounter for closed fracture: Secondary | ICD-10-CM

## 2023-05-05 HISTORY — PX: TOTAL HIP ARTHROPLASTY: SHX124

## 2023-05-05 LAB — CBC
HCT: 38.2 % (ref 36.0–46.0)
Hemoglobin: 12.6 g/dL (ref 12.0–15.0)
MCH: 30.6 pg (ref 26.0–34.0)
MCHC: 33 g/dL (ref 30.0–36.0)
MCV: 92.7 fL (ref 80.0–100.0)
Platelets: 190 10*3/uL (ref 150–400)
RBC: 4.12 MIL/uL (ref 3.87–5.11)
RDW: 13.9 % (ref 11.5–15.5)
WBC: 9 10*3/uL (ref 4.0–10.5)
nRBC: 0 % (ref 0.0–0.2)

## 2023-05-05 LAB — HEMOGLOBIN AND HEMATOCRIT, BLOOD
HCT: 36.6 % (ref 36.0–46.0)
Hemoglobin: 12.1 g/dL (ref 12.0–15.0)

## 2023-05-05 SURGERY — ARTHROPLASTY, HIP, TOTAL,POSTERIOR APPROACH
Anesthesia: General | Site: Hip | Laterality: Left

## 2023-05-05 MED ORDER — PROPOFOL 10 MG/ML IV BOLUS
INTRAVENOUS | Status: AC
  Filled 2023-05-05: qty 20

## 2023-05-05 MED ORDER — ORAL CARE MOUTH RINSE: 15.0000 mL | Freq: Once | OROMUCOSAL | Status: AC

## 2023-05-05 MED ORDER — SUGAMMADEX SODIUM 200 MG/2ML IV SOLN
INTRAVENOUS | Status: AC
  Filled 2023-05-05: qty 4

## 2023-05-05 MED ORDER — LACTATED RINGERS IV SOLN: INTRAVENOUS | Status: DC

## 2023-05-05 MED ORDER — EPHEDRINE SULFATE-NACL 50-0.9 MG/10ML-% IV SOSY
PREFILLED_SYRINGE | INTRAVENOUS | Status: DC | PRN
  Administered 2023-05-05: 5 mg via INTRAVENOUS
  Administered 2023-05-05: 10 mg via INTRAVENOUS

## 2023-05-05 MED ORDER — OXYCODONE HCL 5 MG PO TABS
10.0000 mg | ORAL_TABLET | Freq: Once | ORAL | Status: AC
  Administered 2023-05-05: 10 mg via ORAL
  Filled 2023-05-05: qty 2

## 2023-05-05 MED ORDER — FENTANYL CITRATE PF 50 MCG/ML IJ SOSY
50.0000 ug | PREFILLED_SYRINGE | INTRAMUSCULAR | Status: DC | PRN
  Administered 2023-05-06 – 2023-05-07 (×3): 50 ug via INTRAVENOUS
  Filled 2023-05-05 (×3): qty 1

## 2023-05-05 MED ORDER — VANCOMYCIN HCL 1000 MG IV SOLR
INTRAVENOUS | Status: AC
  Filled 2023-05-05: qty 20

## 2023-05-05 MED ORDER — STERILE WATER FOR IRRIGATION IR SOLN
Status: DC | PRN
  Administered 2023-05-05: 1000 mL

## 2023-05-05 MED ORDER — PHENYLEPHRINE 80 MCG/ML (10ML) SYRINGE FOR IV PUSH (FOR BLOOD PRESSURE SUPPORT)
PREFILLED_SYRINGE | INTRAVENOUS | Status: DC | PRN
  Administered 2023-05-05: 160 ug via INTRAVENOUS
  Administered 2023-05-05 (×2): 80 ug via INTRAVENOUS

## 2023-05-05 MED ORDER — BUPIVACAINE IN DEXTROSE 0.75-8.25 % IT SOLN
INTRATHECAL | Status: DC | PRN
  Administered 2023-05-05: 1.6 mL via INTRATHECAL

## 2023-05-05 MED ORDER — PHENYLEPHRINE HCL-NACL 20-0.9 MG/250ML-% IV SOLN
INTRAVENOUS | Status: AC
  Filled 2023-05-05: qty 250

## 2023-05-05 MED ORDER — FENTANYL CITRATE (PF) 100 MCG/2ML IJ SOLN
INTRAMUSCULAR | Status: AC
  Filled 2023-05-05: qty 2

## 2023-05-05 MED ORDER — PHENYLEPHRINE HCL-NACL 20-0.9 MG/250ML-% IV SOLN
INTRAVENOUS | Status: DC | PRN
  Administered 2023-05-05: 50 ug/min via INTRAVENOUS

## 2023-05-05 MED ORDER — FENTANYL CITRATE PF 50 MCG/ML IJ SOSY
25.0000 ug | PREFILLED_SYRINGE | INTRAMUSCULAR | Status: DC | PRN
  Administered 2023-05-05: 50 ug via INTRAVENOUS
  Filled 2023-05-05: qty 1

## 2023-05-05 MED ORDER — CHLORHEXIDINE GLUCONATE 0.12 % MT SOLN
15.0000 mL | Freq: Once | OROMUCOSAL | Status: AC
  Administered 2023-05-05: 15 mL via OROMUCOSAL
  Filled 2023-05-05: qty 15

## 2023-05-05 MED ORDER — CEFAZOLIN SODIUM-DEXTROSE 2-4 GM/100ML-% IV SOLN
2.0000 g | Freq: Three times a day (TID) | INTRAVENOUS | Status: AC
Start: 2023-05-05 — End: 2023-05-06
  Administered 2023-05-05 – 2023-05-06 (×3): 2 g via INTRAVENOUS
  Filled 2023-05-05 (×3): qty 100

## 2023-05-05 MED ORDER — OXYCODONE HCL 5 MG/5ML PO SOLN: 5.0000 mg | Freq: Once | ORAL | Status: DC | PRN

## 2023-05-05 MED ORDER — SEVOFLURANE IN SOLN
RESPIRATORY_TRACT | Status: AC
  Filled 2023-05-05: qty 500

## 2023-05-05 MED ORDER — LACTATED RINGERS IV SOLN: INTRAVENOUS | Status: DC | PRN

## 2023-05-05 MED ORDER — BUPIVACAINE-EPINEPHRINE (PF) 0.5% -1:200000 IJ SOLN
INTRAMUSCULAR | Status: AC
  Filled 2023-05-05: qty 30

## 2023-05-05 MED ORDER — PROPOFOL 500 MG/50ML IV EMUL
INTRAVENOUS | Status: DC | PRN
  Administered 2023-05-05: 50 ug/kg/min via INTRAVENOUS
  Administered 2023-05-05: 100 ug/kg/min via INTRAVENOUS
  Administered 2023-05-05 (×3): 30 mg via INTRAVENOUS

## 2023-05-05 MED ORDER — SODIUM CHLORIDE 0.9 % IR SOLN
Status: DC | PRN
Start: 2023-05-05 — End: 2023-05-05
  Administered 2023-05-05: 3000 mL

## 2023-05-05 MED ORDER — VANCOMYCIN HCL 1000 MG IV SOLR
INTRAVENOUS | Status: DC | PRN
  Administered 2023-05-05: 1000 mg

## 2023-05-05 MED ORDER — POLYETHYLENE GLYCOL 3350 17 G PO PACK
17.0000 g | PACK | Freq: Every day | ORAL | Status: DC
  Administered 2023-05-05 – 2023-05-08 (×4): 17 g via ORAL
  Filled 2023-05-05 (×4): qty 1

## 2023-05-05 MED ORDER — PROPOFOL 500 MG/50ML IV EMUL
INTRAVENOUS | Status: AC
Start: 2023-05-05 — End: ?
  Filled 2023-05-05: qty 100

## 2023-05-05 MED ORDER — EPHEDRINE 5 MG/ML INJ
INTRAVENOUS | Status: AC
  Filled 2023-05-05: qty 10

## 2023-05-05 MED ORDER — 0.9 % SODIUM CHLORIDE (POUR BTL) OPTIME
TOPICAL | Status: DC | PRN
  Administered 2023-05-05: 1000 mL

## 2023-05-05 MED ORDER — OXYCODONE HCL 5 MG PO TABS: 5.0000 mg | ORAL_TABLET | Freq: Once | ORAL | Status: DC | PRN

## 2023-05-05 MED ORDER — ONDANSETRON HCL 4 MG/2ML IJ SOLN: 4.0000 mg | Freq: Once | INTRAMUSCULAR | Status: DC | PRN

## 2023-05-05 SURGICAL SUPPLY — 52 items
BIPOLAR PROS AML 46 (Hips) ×1 IMPLANT
BIT DRILL 2.8X128 (BIT) ×2 IMPLANT
BLADE HEX COATED 2.75 (ELECTRODE) ×2 IMPLANT
BLADE SAGITTAL 25.0X1.27X90 (BLADE) ×2 IMPLANT
CHLORAPREP W/TINT 26 (MISCELLANEOUS) IMPLANT
COUNTER NDL MAGNETIC 40 RED (SET/KITS/TRAYS/PACK) ×2 IMPLANT
COUNTER NEEDLE MAGNETIC 40 RED (SET/KITS/TRAYS/PACK) ×1 IMPLANT
COVER LIGHT HANDLE STERIS (MISCELLANEOUS) ×4 IMPLANT
DECANTER SPIKE VIAL GLASS SM (MISCELLANEOUS) IMPLANT
DRAPE BACK TABLE (DRAPES) ×2 IMPLANT
DRAPE HIP W/POCKET STRL (MISCELLANEOUS) ×2 IMPLANT
DRAPE U-SHAPE 47X51 STRL (DRAPES) ×2 IMPLANT
DRESSING AQUACEL AG ADV 3.5X12 (MISCELLANEOUS) IMPLANT
DRSG AQUACEL AG ADV 3.5X12 (MISCELLANEOUS) ×1 IMPLANT
DRSG MEPILEX SACRM 8.7X9.8 (GAUZE/BANDAGES/DRESSINGS) ×2 IMPLANT
ELECT REM PT RETURN 9FT ADLT (ELECTROSURGICAL) ×1 IMPLANT
ELECTRODE REM PT RTRN 9FT ADLT (ELECTROSURGICAL) ×2 IMPLANT
GLOVE BIO SURGEON STRL SZ8 (GLOVE) ×8 IMPLANT
GLOVE BIOGEL PI IND STRL 7.0 (GLOVE) ×4 IMPLANT
GLOVE BIOGEL PI IND STRL 7.5 (GLOVE) IMPLANT
GLOVE BIOGEL PI IND STRL 8 (GLOVE) ×2 IMPLANT
GLOVE SURG SS PI 7.5 STRL IVOR (GLOVE) IMPLANT
GOWN STRL REUS W/TWL LRG LVL3 (GOWN DISPOSABLE) ×2 IMPLANT
GOWN STRL REUS W/TWL XL LVL3 (GOWN DISPOSABLE) ×8 IMPLANT
HEAD BIPOLAR PROS AML 46 (Hips) IMPLANT
HEAD FEM STD 28X+1.5 STRL (Hips) IMPLANT
INST SET MAJOR BONE (KITS) ×2 IMPLANT
KIT TURNOVER KIT A (KITS) ×2 IMPLANT
MANIFOLD NEPTUNE II (INSTRUMENTS) ×2 IMPLANT
MARKER SKIN DUAL TIP RULER LAB (MISCELLANEOUS) ×2 IMPLANT
NDL HYPO 21X1.5 SAFETY (NEEDLE) ×2 IMPLANT
NDL MAYO 1/2 CRC TROCAR PT (NEEDLE) IMPLANT
NEEDLE HYPO 21X1.5 SAFETY (NEEDLE) ×1 IMPLANT
NEEDLE MAYO 1/2 CRC TROCAR PT (NEEDLE) ×1 IMPLANT
NS IRRIG 1000ML POUR BTL (IV SOLUTION) ×2 IMPLANT
PACK TOTAL JOINT (CUSTOM PROCEDURE TRAY) ×2 IMPLANT
PAD ARMBOARD 7.5X6 YLW CONV (MISCELLANEOUS) ×2 IMPLANT
PASSER SUT SWANSON 36MM LOOP (INSTRUMENTS) IMPLANT
POSITIONER HEAD 8X9X4 ADT (SOFTGOODS) ×2 IMPLANT
SET BASIN LINEN APH (SET/KITS/TRAYS/PACK) ×2 IMPLANT
SET HNDPC FAN SPRY TIP SCT (DISPOSABLE) ×2 IMPLANT
SOL .9 NS 3000ML IRR UROMATIC (IV SOLUTION) ×2 IMPLANT
STEM FEMORAL SZ8 STD ACTIS (Stem) IMPLANT
STRIP CLOSURE SKIN 1/2X4 (GAUZE/BANDAGES/DRESSINGS) IMPLANT
SUT MNCRL AB 4-0 PS2 18 (SUTURE) IMPLANT
SUT MON AB 2-0 CT1 36 (SUTURE) IMPLANT
SUT VIC AB 1 CT1 27XBRD ANTBC (SUTURE) ×4 IMPLANT
SYR 30ML LL (SYRINGE) IMPLANT
SYR BULB IRRIG 60ML STRL (SYRINGE) ×2 IMPLANT
TOWEL OR 17X26 4PK STRL BLUE (TOWEL DISPOSABLE) ×2 IMPLANT
TRAY FOLEY MTR SLVR 16FR STAT (SET/KITS/TRAYS/PACK) ×2 IMPLANT
YANKAUER SUCT 12FT TUBE ARGYLE (SUCTIONS) ×2 IMPLANT

## 2023-05-05 NOTE — Transfer of Care (Signed)
 Immediate Anesthesia Transfer of Care Note  Patient: Melanie Butler  Procedure(s) Performed: ARTHROPLASTY, HIP, TOTAL,POSTERIOR APPROACH (Left: Hip)  Patient Location: PACU  Anesthesia Type:General  Level of Consciousness: awake  Airway & Oxygen Therapy: Patient Spontanous Breathing and Patient connected to nasal cannula oxygen  Post-op Assessment: Report given to RN and Post -op Vital signs reviewed and stable  Post vital signs: Reviewed and stable  Last Vitals:  Vitals Value Taken Time  BP 106/54 05/05/23 1030  Temp 97.5   Pulse 69 05/05/23 1032  Resp 21 05/05/23 1032  SpO2 92 % 05/05/23 1032  Vitals shown include unfiled device data.  Last Pain:  Vitals:   05/05/23 0637  TempSrc:   PainSc: 2       Patients Stated Pain Goal: 1 (05/04/23 2238)  Complications: No notable events documented.

## 2023-05-05 NOTE — Op Note (Signed)
 Orthopaedic Surgery Operative Note (CSN: [id number])  Melanie Butler  [birth year] Date of Surgery: 05/05/2023   Diagnoses:  Left femoral neck fracture  Procedure: Left hip hemiarthroplasty   Operative Finding Successful completion of the planned procedure.    Post-Op Diagnosis: Same Surgeons:Primary: Oliver Barre, MD Assistants: Rebeca Alert Location: [facility] OR ROOM 4 Anesthesia: Sedation plus regional anesthesia Antibiotics: Ancef 2 g with local vancomycin powder 1 g at the surgical site Tourniquet time: N/A Estimated Blood Loss: 250 cc Complications: None Specimens: None  Implants: Implant Name Type Inv. Item Serial No. Manufacturer Lot No. LRB No. Used Action  BIPOLAR PROS AML 46 - [id number] Hips BIPOLAR PROS AML 46  [employer]PU[employer] [id number] Left 1 Implanted  STEM FEMORAL SZ8 STD ACTIS - [id number] Stem STEM FEMORAL SZ8 STD ACTIS  DEPUY ORTHOPA[employer] [id number] Left 1 Implanted  HEAD FEM STD 28X+1.5 STRL - [id number] Hips HEAD FEM STD 28X+1.5 STRL  DEPUY ORTH[employer] [id number] Left 1 Implanted    Indications for Surgery:   Melanie Butler is a 78 y.o. female who tripped and fell on her left hip.  She sustained a displaced left femoral neck fracture.  In order to restore form and function, I recommended surgery in the form of a hip hemiarthroplasty.  Benefits and risks of operative and nonoperative management were discussed prior to surgery with the patient and informed consent form was completed.  Specific risks including infection, need for additional surgery, bleeding, fracture, dislocation, persistent pain, stiffness, poor integration of the prosthesis, blood clots and more severe complications associated with anesthesia.  She elected to proceed.  All questions were answered.  Surgical consent was finalized.    Procedure:   The patient was identified properly. Informed consent was obtained and the surgical site was marked. The patient was taken up to suite where general anesthesia  was induced.  The patient was positioned lateral, with her left hip up.  The left leg was prepped and draped in the usual sterile fashion.  Timeout was performed before the beginning of the case.  Ancef dosing was confirmed prior to incision.  Patient received 1 g of TXA before the start of the case  We started by making an incision centered over the greater trochanter with a scalpel. We used the scalpel to continue to dissect to the fascia.  Electrocautery was used to help with hemostasis.  A cobb elevator was used to clear the IT band and then it was pierced with Bovie electrocautery. Mayo scissors were used to cut the fascia in a longitudinal fashion. The gluteus maximus fibers were bluntly split in line with their fibers.   The femur was slowly internally rotated, putting tension on the posterior structures. The short external rotators were identified and tagged with #1 vicryl.  Bovie electrocautery was used to dissect the short external rotators off of the insertion onto the femur.  Next, we identified the capsule and made a T capsulotomy.  The capsule was then tagged with #1 Vicryl sutures.   Next, we visualized the femoral neck fracture. We identified the sciatic nerve by palpation and verified that it was not in danger during the dissection.    We then made a neck cut approximately 1 cm above the lesser trochanter with a saw. We removed the femoral head. We used the box cutter to cut away some of the greater trochanter for ease of insertion of the stem. We then used the canal finder to locate the femoral canal.   Using the  angle of the femoral neck as our guide for version, we broached sequentially. We trialed components and found appropriate fit and stability.  The patient's bone was stable enough to get an excellent pressfit with the stem.  We placed an appropriately sized head on the stem.  The hip was reduced and we noted full extension of the hip. The hip was stable at 90 degrees of flexion, and  45 degrees of internal rotation.  Leg lengths were approximately equal.  We then removed the trials as well as the broach. We ensured there were no foreign bodies or bone within the acetabulum. We copiously irrigated the wound.    The appropriately sized stem was then opened and this was pressfit within the canal.  We achieved excellent fit and the stem was stable.  An appropriately sized head was placed on to the stem and the hip was reduced. Again, we noted it was stable in the previously mentioned manipulations.   The wound was copiously irrigated again.  We placed 1 g of Vancomycin powder within the hip.  Next,  we repaired the posterior capsule.  The short external rotators were repaired to the greater trochanter through bone tunnels. We closed the fascia of the iliotibial band and gluteus maximus with running and intterupted Vicryl sutures. We then closed Scarpa's fascia with Vicryl sutures. Skin was closed with 2-0 monocryl and 4-0 Monocryl with Steri-Strips and an Aquacel dressing was placed.  A hib abduction pillow was secured.  Patient was awoken taken to PACU in stable condition.   Post-operative plan:  The patient will be WBAT on the operative extremity. Hip abduction pillow in place at all times when in bed Posterior hip precautions PT/OT    DVT prophylaxis per primary team, no orthopedic contraindications.   Recommend 81 mg of aspirin BID x 6 weeks.  Pain control with PRN pain medication preferring oral medicines.   Follow up plan will be scheduled in approximately 14 days for incision check and XR.

## 2023-05-05 NOTE — Plan of Care (Signed)
  Problem: Education: Goal: Knowledge of General Education information will improve Description: Including pain rating scale, medication(s)/side effects and non-pharmacologic comfort measures Outcome: Progressing   Problem: Health Behavior/Discharge Planning: Goal: Ability to manage health-related needs will improve Outcome: Progressing   Problem: Clinical Measurements: Goal: Ability to maintain clinical measurements within normal limits will improve Outcome: Progressing   Problem: Nutrition: Goal: Adequate nutrition will be maintained Outcome: Progressing   Problem: Coping: Goal: Level of anxiety will decrease Outcome: Progressing   Problem: Elimination: Goal: Will not experience complications related to bowel motility Outcome: Progressing   Problem: Pain Managment: Goal: General experience of comfort will improve and/or be controlled Outcome: Progressing   Problem: Safety: Goal: Ability to remain free from injury will improve Outcome: Progressing   Problem: Skin Integrity: Goal: Risk for impaired skin integrity will decrease Outcome: Progressing

## 2023-05-05 NOTE — Progress Notes (Signed)
   ORTHOPAEDIC PROGRESS NOTE  Scheduled for Left Hip hemiarthroplasty  DOS: 05/05/23  SUBJECTIVE: No issues over night.  Pain is controlled.  NPO since midnight  OBJECTIVE: PE:  Alert and oriented, no acute distress  Left hip in fixed position Toes are warm and well perfused Active motion intact to the dorsum of her foot Wiggles toes Active dorsiflexion of the ankle  Vitals:   05/05/23 0640 05/05/23 0645  BP:    Pulse: 73 68  Resp: 19 16  Temp:    SpO2: 93% 95%     ASSESSMENT: Melanie Butler is a 78 y.o. female doing well.  Ready for OR.  NPO since midnight.   PLAN: Weightbearing: NWB LLE Incisional and dressing care: Reinforce dressings as needed; none currently Orthopedic device(s): None VTE prophylaxis: Recommend Aspirin 81mg  BID; to begin POD#1 Pain control: PO pain medications as needed; judicious use of narcotics Follow - up plan: 2 weeks   Contact information:     Brenley Priore A. Dallas Schimke, MD MS [facility] [address] [place],  [place]  [address] Phone: [phone] Fax: [phone]

## 2023-05-05 NOTE — TOC Initial Note (Addendum)
 Transition of Care [facility]) - Initial/Assessment Note    Patient Details  Name: Melanie Butler MRN: [id number] Date of Birth: 07-Jul-1945  Transition of Care [facility]) CM/SW Contact:    Elliot Gault, LCSW Phone Number: 05/05/2023, 2:29 PM  Clinical Narrative:                 MD anticipating my pt may need SNF rehab at dc. Spoke with pt to assess and review dc planning.  Per pt, she lives with her husband and their son. Prior to this admission, she was independent in ADLs at home. She was not using any DME.  Reviewed possible need for SNF with pt and pt agreeable to SNF referrals. CMS provider options reviewed. Will refer as requested and start insurance authorization if needed once final disposition recommendation made by PT.  TOC will follow.  Expected Discharge Plan: Skilled Nursing Facility Barriers to Discharge: Continued Medical Work up   Patient Goals and CMS Choice Patient states their goals for this hospitalization and ongoing recovery are:: get better CMS Medicare.gov Compare Post Acute Care list provided to:: Patient Choice offered to / list presented to : Patient [place] ownership interest in [facility].provided to:: Patient    Expected Discharge Plan and Services In-house Referral: Clinical Social Work   Post Acute Care Choice: Skilled Nursing Facility Living arrangements for the past 2 months: Single Family Home                                      Prior Living Arrangements/Services Living arrangements for the past 2 months: Single Family Home Lives with:: Adult Children, Spouse Patient language and need for interpreter reviewed:: Yes Do you feel safe going back to the place where you live?: Yes      Need for Family Participation in Patient Care: No (Comment) Care giver support system in place?: Yes (comment)   Criminal Activity/Legal Involvement Pertinent to Current Situation/Hospitalization: No - Comment as needed  Activities of Daily  Living   ADL Screening (condition at time of admission) Independently performs ADLs?: Yes (appropriate for developmental age) Is the patient deaf or have difficulty hearing?: Yes Does the patient have difficulty seeing, even when wearing glasses/contacts?: No Does the patient have difficulty concentrating, remembering, or making decisions?: No  Permission Sought/Granted Permission sought to share information with : Facility Industrial/product designer granted to share information with : Yes, Verbal Permission Granted     Permission granted to share info w AGENCY: sn[facility]        Emotional Assessment Appearance:: Appears stated age Attitude/Demeanor/Rapport: Engaged Affect (typically observed): Pleasant Orientation: : Oriented to Self, Oriented to Place, Oriented to  Time, Oriented to Situation Alcohol / Substance Use: Not Applicable Psych Involvement: No (comment)  Admission diagnosis:  Closed fracture of left hip, initial encounter (HCC) [S72.002A] Closed left hip fracture, initial encounter [facility]) [S72.002A] Patient Active Problem List   Diagnosis Date Noted   Closed left hip fracture, initial encounter (HCC) 05/04/2023   HLD (hyperlipidemia) 05/04/2023   Hypothyroidism 05/04/2023   Asthma, chronic 05/04/2023   COPD/Emphysema 05/04/2023   PCP:  Laurann Montana, MD Pharmacy:   [phone] [facility] [place],  - [address] AT [facility] [address] [place] [address] Phone: [phone] Fax: [phone]     Social Drivers of Health (SDOH) Social History: SDOH Screenings   Food Insecurity:  No Food Insecurity (05/04/2023)  Housing: Low Risk  (05/05/2023)  Transportation Needs: No Transportation Needs (05/04/2023)  Utilities: Not At Risk (05/04/2023)  Social Connections: Socially Integrated (05/04/2023)  Tobacco Use: Low Risk  (05/05/2023)   SDOH Interventions:     Readmission Risk Interventions     No data to display

## 2023-05-05 NOTE — Anesthesia Postprocedure Evaluation (Signed)
 Anesthesia Post Note  Patient: Melanie Butler  Procedure(s) Performed: ARTHROPLASTY, HIP, TOTAL,POSTERIOR APPROACH (Left: Hip)  Patient location during evaluation: Phase II Anesthesia Type: Spinal Level of consciousness: awake Pain management: pain level controlled Vital Signs Assessment: post-procedure vital signs reviewed and stable Respiratory status: spontaneous breathing and respiratory function stable Cardiovascular status: blood pressure returned to baseline and stable Postop Assessment: no headache and no apparent nausea or vomiting Anesthetic complications: no Comments: Late entry   No notable events documented.   Last Vitals:  Vitals:   05/05/23 1100 05/05/23 1115  BP: (!) 117/55 (!) 113/59  Pulse: 78 80  Resp: 18 16  Temp:    SpO2: 93% 96%    Last Pain:  Vitals:   05/05/23 1048  TempSrc:   PainSc: 8                  Windell Norfolk

## 2023-05-05 NOTE — NC FL2 (Signed)
 [place] MEDICAID FL2 LEVEL OF CARE FORM     IDENTIFICATION  Patient Name: Melanie Butler Birthdate: 06-25-1945 Sex: female Admission Date (Current Location): 05/04/2023  [facility] and [place] Number:  [employer] and Address:  [facility],  [address], [place] [address]      Provider Number: [id number]  Attending Physician Name and Address:  Shon Hale, MD  Relative Name and Phone Number:       Current Level of Care: Hospital Recommended Level of Care: Skilled Nursing Facility Prior Approval Number:    Date Approved/Denied:   PASRR Number: [id number]  Discharge Plan: SNF    Current Diagnoses: Patient Active Problem List   Diagnosis Date Noted   Closed left hip fracture, initial encounter (HCC) 05/04/2023   HLD (hyperlipidemia) 05/04/2023   Hypothyroidism 05/04/2023   Asthma, chronic 05/04/2023   COPD/Emphysema 05/04/2023    Orientation RESPIRATION BLADDER Height & Weight     Self, Time, Situation, Place  O2 (see dc summary) Continent Weight: 158 lb 11.7 oz (72 kg) Height:  5\' 3"  (160 cm)  BEHAVIORAL SYMPTOMS/MOOD NEUROLOGICAL BOWEL NUTRITION STATUS      Continent Diet (see dc summaryu)  AMBULATORY STATUS COMMUNICATION OF NEEDS Skin   Extensive Assist Verbally Surgical wounds                       Personal Care Assistance Level of Assistance  Bathing, Feeding, Dressing Bathing Assistance: Limited assistance Feeding assistance: Independent Dressing Assistance: Limited assistance     Functional Limitations Info  Sight, Speech, Hearing Sight Info: Adequate Hearing Info: Adequate Speech Info: Adequate    SPECIAL CARE FACTORS FREQUENCY  PT (By licensed PT), OT (By licensed OT)     PT Frequency: 5x week OT Frequency: 5x week            Contractures Contractures Info: Not present    Additional Factors Info  Code Status, Allergies Code Status Info: Full Allergies Info: Ceclor, Tetanus Toxoids            Current Medications (05/05/2023):  This is the current hospital active medication list Current Facility-Administered Medications  Medication Dose Route Frequency Provider Last Rate Last Admin   acetaminophen (TYLENOL) tablet 650 mg  650 mg Oral Q6H PRN Oliver Barre, MD       Or   acetaminophen (TYLENOL) suppository 650 mg  650 mg Rectal Q6H PRN Oliver Barre, MD       albuterol (PROVENTIL) (2.5 MG/3ML) 0.083% nebulizer solution 2.5 mg  2.5 mg Nebulization Q2H PRN Oliver Barre, MD       bisacodyl (DULCOLAX) suppository 10 mg  10 mg Rectal Daily PRN Oliver Barre, MD       budesonide (PULMICORT) nebulizer solution 0.25 mg  0.25 mg Nebulization BID Thane Edu A, MD   0.25 mg at 05/04/23 1949   ceFAZolin (ANCEF) IVPB 2g/100 mL premix  2 g Intravenous Q8H Oliver Barre, MD       cholecalciferol (VITAMIN D3) 25 MCG (1000 UNIT) tablet 4,000 Units  4,000 Units Oral Daily Oliver Barre, MD   4,000 Units at 05/04/23 1127   fentaNYL (SUBLIMAZE) injection 50 mcg  50 mcg Intravenous Q2H PRN Shon Hale, MD       [START ON 05/06/2023] heparin injection 5,000 Units  5,000 Units Subcutaneous Q8H Oliver Barre, MD       levothyroxine (SYNTHROID) tablet 137 mcg  137 mcg Oral Q0600 Dallas Schimke,  Redge Gainer, MD   137 mcg at 05/05/23 0554   methocarbamol (ROBAXIN) tablet 750 mg  750 mg Oral TID Oliver Barre, MD   750 mg at 05/04/23 2209   ondansetron [facility]) tablet 4 mg  4 mg Oral Q6H PRN Oliver Barre, MD       Or   ondansetron [facility]) injection 4 mg  4 mg Intravenous Q6H PRN Oliver Barre, MD       oxyCODONE (Oxy IR/ROXICODONE) immediate release tablet 5 mg  5 mg Oral Q4H PRN Oliver Barre, MD   5 mg at 05/04/23 1705   polyethylene glycol (MIRALAX / GLYCOLAX) packet 17 g  17 g Oral Daily Shon Hale, MD   17 g at 05/05/23 1208   senna-docusate (Senokot-S) tablet 2 tablet  2 tablet Oral QHS Oliver Barre, MD   2 tablet at 05/04/23 2209   sodium chloride flush (NS) 0.9 % injection 3 mL  3 mL  Intravenous Q12H Thane Edu A, MD   3 mL at 05/04/23 1017   sodium chloride flush (NS) 0.9 % injection 3 mL  3 mL Intravenous Q12H Thane Edu A, MD   3 mL at 05/04/23 2211   sodium chloride flush (NS) 0.9 % injection 3 mL  3 mL Intravenous PRN Oliver Barre, MD       traZODone (DESYREL) tablet 50 mg  50 mg Oral QHS PRN Oliver Barre, MD       umeclidinium bromide (INCRUSE ELLIPTA) 62.5 MCG/ACT 1 puff  1 puff Inhalation Daily Oliver Barre, MD         Discharge Medications: Please see discharge summary for a list of discharge medications.  Relevant Imaging Results:  Relevant Lab Results:   Additional Information SSN: [id number]  Elliot Gault, LCSW

## 2023-05-05 NOTE — Plan of Care (Signed)

## 2023-05-05 NOTE — Progress Notes (Signed)
 PROGRESS NOTE   Melanie Butler, is a 78 y.o. female, DOB - [birth year], WGN:[id number]  Admit date - 05/04/2023   Admitting Physician Briscoe Deutscher, MD  Outpatient Primary MD for the patient is Laurann Montana, MD  LOS - 1  Chief Complaint  Patient presents with   Fall   Hip Pain        Brief Narrative:  Melanie Butler is a 78 y.o. female with medical history significant for chronic asthma, HLD and hypothyroidism who presents from home with left hip pain after mechanical fall from tripping over a rug .  She was admitted on 05/04/2023 with left hip fracture, underwent ORIF on 05/05/2023    -Assessment and Plan: 1)LT Hip Fx ---Impacted and otherwise nondisplaced subcapital transverse fracture of the proximal left femur. -s/p ORIF on 05/05/23 --IV fentanyl and oral oxycodone for pain control along with methocarbamol -Post-operative plan:  The patient will be WBAT on the operative extremity. Hip abduction pillow in place at all times when in bed Posterior hip precautions PT/OT    DVT prophylaxis with subcu Heparin while inpatient and Recommend 81 mg of aspirin BID x 6 weeks postdischarge Follow up plan will be scheduled in approximately 14 days for incision check and XR.   2)Asthma/Emphysema--- no acute exacerbation at this time  -c/n bronchodilators   3)HLD-statin due to trauma/hip fracture with risk for elevated CKs   4)Hypothyroidism--continue levothyroxine  Status is: Inpatient   Disposition: The patient is from: Home              Anticipated d/c is to: SNF              Anticipated d/c date is: 1 day              Patient currently is not medically stable to d/c. Barriers: Not Clinically Stable-   Code Status :  -  Code Status: Full Code   Family Communication:    (patient is alert, awake and coherent)  Son Kathlene November is Primary contact DVT Prophylaxis  :   - SCDs *  heparin injection 5,000 Units Start: 05/06/23 2200 SCDs Start: 05/04/23 0817 Place TED hose Start: 05/04/23  0817  Lab Results  Component Value Date   PLT 190 05/05/2023   Inpatient Medications  Scheduled Meds:  budesonide (PULMICORT) nebulizer solution  0.25 mg Nebulization BID   cholecalciferol  4,000 Units Oral Daily   [START ON 05/06/2023] heparin  5,000 Units Subcutaneous Q8H   levothyroxine  137 mcg Oral Q0600   methocarbamol  750 mg Oral TID   polyethylene glycol  17 g Oral Daily   senna-docusate  2 tablet Oral QHS   sodium chloride flush  3 mL Intravenous Q12H   sodium chloride flush  3 mL Intravenous Q12H   umeclidinium bromide  1 puff Inhalation Daily   Continuous Infusions:   ceFAZolin (ANCEF) IV     PRN Meds:.acetaminophen **OR** acetaminophen, albuterol, bisacodyl, fentaNYL (SUBLIMAZE) injection, ondansetron **OR** ondansetron (ZOFRAN) IV, oxyCODONE, sodium chloride flush, traZODone   Anti-infectives (From admission, onward)    Start     Dose/Rate Route Frequency Ordered Stop   05/05/23 1400  ceFAZolin (ANCEF) IVPB 2g/100 mL premix        2 g 200 mL/hr over 30 Minutes Intravenous Every 8 hours 05/05/23 1147 05/06/23 1359   05/05/23 0937  vancomycin (VANCOCIN) powder  Status:  Discontinued          As needed 05/05/23 7846 05/05/23 1138   05/05/23  0700  ceFAZolin (ANCEF) IVPB 2g/100 mL premix        2 g 200 mL/hr over 30 Minutes Intravenous On call to O.R. 05/04/23 1319 05/05/23 0825        Subjective: Melanie Butler today has no fevers, no emesis,  No chest pain,   - Resting comfortably postoperatively  Objective: Vitals:   05/05/23 1045 05/05/23 1100 05/05/23 1115 05/05/23 1140  BP: (!) 94/44 (!) 117/55 (!) 113/59 131/70  Pulse: 77 78 80 78  Resp: (!) 24 18 16 20   Temp:    (!) 97.5 F (36.4 C)  TempSrc:    Oral  SpO2: 96% 93% 96% 91%  Weight:      Height:        Intake/Output Summary (Last 24 hours) at 05/05/2023 1357 Last data filed at 05/05/2023 1214 Gross per 24 hour  Intake 1200 ml  Output 750 ml  Net 450 ml   Filed Weights   05/04/23 0947 05/04/23  1654  Weight: 72.6 kg 72 kg    Physical Exam  Gen:- Awake Alert, in no acute distress HEENT:- [place].AT, No sclera icterus Neck-Supple Neck,No JVD,.  Lungs-  CTAB , fair symmetrical air movement CV- S1, S2 normal, regular  Abd-  +ve B.Sounds, Abd Soft, No tenderness,    Extremity/Skin:- No  edema, pedal pulses present  Psych-affect is appropriate, oriented x3 Neuro-generalized weakness, no new focal deficits, no tremors MSK-left hip with point tenderness, postop wound dry and intact  Data Reviewed: I have personally reviewed following labs and imaging studies  CBC: Recent Labs  Lab 05/04/23 0451 05/05/23 0314 05/05/23 1052  WBC 17.0* 9.0  --   NEUTROABS 14.4*  --   --   HGB 13.7 12.6 12.1  HCT 41.0 38.2 36.6  MCV 90.9 92.7  --   PLT 248 190  --    Basic Metabolic Panel: Recent Labs  Lab 05/04/23 0451  NA 136  K 3.5  CL 103  CO2 22  GLUCOSE 123*  BUN 21  CREATININE 0.95  CALCIUM 9.6   GFR: Estimated Creatinine Clearance: 45.6 mL/min (by C-G formula based on SCr of 0.95 mg/dL). Liver Function Tests: Recent Labs  Lab 05/04/23 0451  AST 26  ALT 26  ALKPHOS 83  BILITOT 0.8  PROT 6.5  ALBUMIN 3.9   Radiology Studies: DG HIP UNILAT WITH PELVIS 2-3 VIEWS LEFT Result Date: 05/05/2023 CLINICAL DATA:  Hip fracture.  ORIF EXAM: DG HIP (WITH OR WITHOUT PELVIS) 3V LEFT COMPARISON:  Preop x-ray 05/04/2023 FINDINGS: Osteopenia. Interval placement of a left hip hemiarthroplasty with Press-Fit components. Expected alignment. Lateral soft tissue gas. No additional fracture or dislocation. Mild degenerative changes of the right hip and sacroiliac joints. Imaging was obtained to aid in treatment. Scattered presumed vascular calcifications along the pelvis. IMPRESSION: Interval acute surgical changes of left hip hemiarthroplasty. Electronically Signed   By: Karen Kays M.D.   On: 05/05/2023 12:04   DG CHEST PORT 1 VIEW Result Date: 05/04/2023 CLINICAL DATA:  [id number].  Left hip  fracture. [id number].  Preoperative testing prior to surgery. EXAM: PORTABLE CHEST 1 VIEW COMPARISON:  Partial chest CT for coronary calcium scoring 02/18/2023, PA and lateral chest 09/20/2022. FINDINGS: The lungs are slightly emphysematous but clear aside from linear scarring or atelectasis along the left heart border. There is mild elevation of the right hemidiaphragm. No pleural effusion is seen. The cardiomediastinal silhouette is normal. There is osteopenia and thoracic spondylosis. There are calcified left axillary lymph nodes. Artifacts  from overlying clothing. IMPRESSION: 1. No evidence of acute chest disease. Emphysema. 2. Osteopenia and thoracic spondylosis. Electronically Signed   By: Almira Bar M.D.   On: 05/04/2023 07:26   DG Hip Unilat W or Wo Pelvis 2-3 Views Left Result Date: 05/04/2023 CLINICAL DATA:  Fall with left hip pain. EXAM: DG HIP (WITH OR WITHOUT PELVIS) 2-3V LEFT COMPARISON:  None Available. FINDINGS: Three views including AP pelvis. There is an impacted and otherwise nondisplaced subcapital transverse acute fracture of the proximal left femur. There is no dislocation. No pelvic fracture or diastasis. The bilateral hip joint spaces are maintained. The SI joints and symphysis pubis are unremarkable, as visualized. The proximal right femur intact AP. There are calcified fibroids in the pelvis. Pelvic phleboliths. Artifacts from overlying clothing. IMPRESSION: Impacted and otherwise nondisplaced subcapital transverse fracture of the proximal left femur. Electronically Signed   By: Almira Bar M.D.   On: 05/04/2023 04:43   Scheduled Meds:  budesonide (PULMICORT) nebulizer solution  0.25 mg Nebulization BID   cholecalciferol  4,000 Units Oral Daily   [START ON 05/06/2023] heparin  5,000 Units Subcutaneous Q8H   levothyroxine  137 mcg Oral Q0600   methocarbamol  750 mg Oral TID   polyethylene glycol  17 g Oral Daily   senna-docusate  2 tablet Oral QHS   sodium chloride flush  3 mL  Intravenous Q12H   sodium chloride flush  3 mL Intravenous Q12H   umeclidinium bromide  1 puff Inhalation Daily   Continuous Infusions:   ceFAZolin (ANCEF) IV      LOS: 1 day   Shon Hale M.D on 05/05/2023 at 1:57 PM  Go to [email or web address] - for contact info  [facility] - Office  [phone]  If 7PM-7AM, please contact night-coverage [email or web address] 05/05/2023, 1:57 PM

## 2023-05-05 NOTE — Progress Notes (Signed)
   05/05/23 1113  TOC Brief Assessment  Insurance and Status Reviewed  Patient has primary care physician Yes  Home environment has been reviewed from home with spouse  Prior level of function: independent  Prior/Current Home Services No current home services  Social Drivers of Health Review SDOH reviewed no interventions necessary  Readmission risk has been reviewed Yes  Transition of care needs no transition of care needs at this time    Reviewed pt's record. Pt's status discussed with MD in progression rounds this AM. No immediate TOC needs identified. TOC will follow and assist if needs arise.

## 2023-05-05 NOTE — Anesthesia Preprocedure Evaluation (Signed)
 Anesthesia Evaluation  Patient identified by MRN, date of birth, ID band Patient awake    Reviewed: Allergy & Precautions, H&P , NPO status , Patient's Chart, lab work & pertinent test results, reviewed documented beta blocker date and time   Airway Mallampati: II  TM Distance: >3 FB Neck ROM: full    Dental no notable dental hx.    Pulmonary neg pulmonary ROS, asthma , COPD   Pulmonary exam normal breath sounds clear to auscultation       Cardiovascular Exercise Tolerance: Good hypertension, negative cardio ROS  Rhythm:regular Rate:Normal     Neuro/Psych negative neurological ROS  negative psych ROS   GI/Hepatic negative GI ROS, Neg liver ROS,,,  Endo/Other  negative endocrine ROSHypothyroidism    Renal/GU negative Renal ROS  negative genitourinary   Musculoskeletal   Abdominal   Peds  Hematology negative hematology ROS (+)   Anesthesia Other Findings   Reproductive/Obstetrics negative OB ROS                             Anesthesia Physical Anesthesia Plan  ASA: 2  Anesthesia Plan: General and Spinal   Post-op Pain Management:    Induction:   PONV Risk Score and Plan: Propofol infusion  Airway Management Planned:   Additional Equipment:   Intra-op Plan:   Post-operative Plan:   Informed Consent: I have reviewed the patients History and Physical, chart, labs and discussed the procedure including the risks, benefits and alternatives for the proposed anesthesia with the patient or authorized representative who has indicated his/her understanding and acceptance.     Dental Advisory Given  Plan Discussed with: CRNA  Anesthesia Plan Comments:        Anesthesia Quick Evaluation

## 2023-05-05 NOTE — Anesthesia Procedure Notes (Signed)
 Spinal  Patient location during procedure: OR Start time: 05/05/2023 7:35 AM End time: 05/05/2023 7:42 AM Reason for block: surgical anesthesia Staffing Performed: resident/CRNA  Resident/CRNA: Lorin Glass, CRNA Performed by: Lorin Glass, CRNA Authorized by: Windell Norfolk, MD   Preanesthetic Checklist Completed: patient identified, IV checked, site marked, risks and benefits discussed, surgical consent, monitors and equipment checked, pre-op evaluation and timeout performed Spinal Block Patient position: right lateral decubitus Prep: ChloraPrep Patient monitoring: heart rate, cardiac monitor, continuous pulse ox and blood pressure Approach: midline Location: L4-5 Injection technique: single-shot Needle Needle type: Pencan  Needle gauge: 24 G Assessment Events: CSF return Additional Notes Spinal Performed by Cornelia Copa, SRNA

## 2023-05-06 ENCOUNTER — Encounter (HOSPITAL_COMMUNITY): Payer: Self-pay | Admitting: Orthopedic Surgery

## 2023-05-06 DIAGNOSIS — S72002A Fracture of unspecified part of neck of left femur, initial encounter for closed fracture: Secondary | ICD-10-CM | POA: Diagnosis not present

## 2023-05-06 LAB — CBC
HCT: 35.9 % — ABNORMAL LOW (ref 36.0–46.0)
Hemoglobin: 11.5 g/dL — ABNORMAL LOW (ref 12.0–15.0)
MCH: 29.8 pg (ref 26.0–34.0)
MCHC: 32 g/dL (ref 30.0–36.0)
MCV: 93 fL (ref 80.0–100.0)
Platelets: 163 10*3/uL (ref 150–400)
RBC: 3.86 MIL/uL — ABNORMAL LOW (ref 3.87–5.11)
RDW: 13.6 % (ref 11.5–15.5)
WBC: 11.2 10*3/uL — ABNORMAL HIGH (ref 4.0–10.5)
nRBC: 0 % (ref 0.0–0.2)

## 2023-05-06 MED ORDER — CHLORHEXIDINE GLUCONATE CLOTH 2 % EX PADS
6.0000 | MEDICATED_PAD | Freq: Every day | CUTANEOUS | Status: DC
  Administered 2023-05-06 – 2023-05-09 (×3): 6 via TOPICAL

## 2023-05-06 MED ORDER — BISACODYL 10 MG RE SUPP
10.0000 mg | Freq: Once | RECTAL | Status: AC
  Administered 2023-05-06: 10 mg via RECTAL
  Filled 2023-05-06: qty 1

## 2023-05-06 NOTE — TOC Progression Note (Signed)
 Transition of Care [facility]) - Progression Note    Patient Details  Name: Melanie Butler MRN: [id number] Date of Birth: Aug 01, 1945  [facility]) CM/SW Contact  Elliot Gault, LCSW Phone Number: 05/06/2023, 3:26 PM  Clinical Narrative:     TOC following. PT recommending SNF. Referrals had been started yesterday with pt consent in anticipation of this need. Spoke with pt and family at bedside to review SNF bed offers. Pt selects [facility]. TOC starting auth today. If pt has Marisa Hua can accept on Monday.  TOC will follow.  Expected Discharge Plan: Skilled Nursing Facility Barriers to Discharge: Continued Medical Work up  Expected Discharge Plan and Services In-house Referral: Clinical Social Work   Post Acute Care Choice: Skilled Nursing Facility Living arrangements for the past 2 months: Single Family Home                                       Social Determinants of Health (SDOH) Interventions SDOH Screenings   Food Insecurity: No Food Insecurity (05/04/2023)  Housing: Low Risk  (05/05/2023)  Transportation Needs: No Transportation Needs (05/04/2023)  Utilities: Not At Risk (05/04/2023)  Social Connections: Socially Integrated (05/04/2023)  Tobacco Use: Low Risk  (05/05/2023)    Readmission Risk Interventions     No data to display

## 2023-05-06 NOTE — Care Management Important Message (Signed)
 Important Message  Patient Details  Name: Melanie Butler MRN: [id number] Date of Birth: 1945/04/29   Important Message Given:  Yes - Medicare IM     Corey Harold 05/06/2023, 12:11 PM

## 2023-05-06 NOTE — Progress Notes (Addendum)
 PROGRESS NOTE  Melanie Butler, is a 78 y.o. female, DOB - 1945-04-25, QMV:[id number]  Admit date - 05/04/2023   Admitting Physician Briscoe Deutscher, MD  Outpatient Primary MD for the patient is Laurann Montana, MD  LOS - 2  Chief Complaint  Patient presents with   Fall   Hip Pain        Brief Narrative:  Melanie Butler is a 78 y.o. female with medical history significant for chronic asthma, HLD and hypothyroidism who presents from home with left hip pain after mechanical fall from tripping over a rug .  She was admitted on 05/04/2023 with left hip fracture, underwent ORIF on 05/05/2023  05/06/23 -Patient is medically stable for transfer to SNF rehab when SNF bed is available and insurance authorization can be obtained   -Assessment and Plan: 1)LT Hip Fx ---Impacted and otherwise nondisplaced subcapital transverse fracture of the proximal left femur. -s/p ORIF on 05/05/23 --IV fentanyl and oral oxycodone for pain control along with methocarbamol -Post-operative plan:  The patient will be WBAT on the operative extremity. Hip abduction pillow in place at all times when in bed Posterior hip precautions PT/OT    DVT prophylaxis with subcu Heparin while inpatient and Recommend 81 mg of aspirin BID x 6 weeks postdischarge Orthopedic Follow up plan will be scheduled in approximately 14 days for incision check and XR.   2)Asthma/Emphysema--- no acute exacerbation at this time  -c/n bronchodilators   3)HLD-hold statin due to trauma/hip fracture with risk for elevated CKs   4)Hypothyroidism--continue levothyroxine  Status is: Inpatient   Disposition: The patient is from: Home              Anticipated d/c is to: SNF              Anticipated d/c date is: 1 day              Patient currently is medically stable to d/c. Barriers: -Patient is medically stable for transfer to SNF rehab when SNF bed is available and insurance authorization can be obtained    Code Status :  -  Code Status: Full Code    Family Communication:    (patient is alert, awake and coherent)  Son Kathlene November is Primary contact DVT Prophylaxis  :   - SCDs *  heparin injection 5,000 Units Start: 05/06/23 2200 SCDs Start: 05/04/23 0817 Place TED hose Start: 05/04/23 0817  Lab Results  Component Value Date   PLT 163 05/06/2023   Inpatient Medications  Scheduled Meds:  budesonide (PULMICORT) nebulizer solution  0.25 mg Nebulization BID   Chlorhexidine Gluconate Cloth  6 each Topical Daily   cholecalciferol  4,000 Units Oral Daily   heparin  5,000 Units Subcutaneous Q8H   levothyroxine  137 mcg Oral Q0600   polyethylene glycol  17 g Oral Daily   senna-docusate  2 tablet Oral QHS   sodium chloride flush  3 mL Intravenous Q12H   sodium chloride flush  3 mL Intravenous Q12H   umeclidinium bromide  1 puff Inhalation Daily   Continuous Infusions:   PRN Meds:.acetaminophen **OR** acetaminophen, albuterol, bisacodyl, fentaNYL (SUBLIMAZE) injection, ondansetron **OR** ondansetron (ZOFRAN) IV, oxyCODONE, sodium chloride flush, traZODone   Anti-infectives (From admission, onward)    Start     Dose/Rate Route Frequency Ordered Stop   05/05/23 1400  ceFAZolin (ANCEF) IVPB 2g/100 mL premix        2 g 200 mL/hr over 30 Minutes Intravenous Every 8 hours 05/05/23 1147 05/06/23  1610   05/05/23 0937  vancomycin (VANCOCIN) powder  Status:  Discontinued          As needed 05/05/23 9604 05/05/23 1138   05/05/23 0700  ceFAZolin (ANCEF) IVPB 2g/100 mL premix        2 g 200 mL/hr over 30 Minutes Intravenous On call to O.R. 05/04/23 1319 05/05/23 0825        Subjective: Melanie Butler today has no fevers, no emesis,  No chest pain,   - Resting comfortably postoperatively -Husband and son at bedside, questions answered -- Pain control improving  -Patient had small BM -Voided after Foley removal  05/06/23 -Patient is medically stable for transfer to SNF rehab when SNF bed is available and insurance authorization can be  obtained    Objective: Vitals:   05/06/23 0009 05/06/23 0508 05/06/23 1028 05/06/23 1300  BP: 138/64 126/60  122/62  Pulse: 85 78  77  Resp: 18 18  18   Temp: 99 F (37.2 C) 98.4 F (36.9 C)  97.8 F (36.6 C)  TempSrc: Oral Oral  Oral  SpO2: 95% 97% 91% 96%  Weight:      Height:        Intake/Output Summary (Last 24 hours) at 05/06/2023 1848 Last data filed at 05/06/2023 1700 Gross per 24 hour  Intake 800 ml  Output 1150 ml  Net -350 ml   Filed Weights   05/04/23 0947 05/04/23 1654  Weight: 72.6 kg 72 kg    Physical Exam Gen:- Awake Alert, in no acute distress HEENT:- [place].AT, No sclera icterus Neck-Supple Neck,No JVD,.  Lungs-  CTAB , fair symmetrical air movement CV- S1, S2 normal, regular  Abd-  +ve B.Sounds, Abd Soft, No tenderness,    Extremity/Skin:- No  edema, pedal pulses present  Psych-affect is appropriate, oriented x3 Neuro-generalized weakness, no new focal deficits, no tremors MSK-Left hip postop wound dry and intact GU--Foley to be removed on 05/06/2023  Data Reviewed: I have personally reviewed following labs and imaging studies  CBC: Recent Labs  Lab 05/04/23 0451 05/05/23 0314 05/05/23 1052 05/06/23 0259  WBC 17.0* 9.0  --  11.2*  NEUTROABS 14.4*  --   --   --   HGB 13.7 12.6 12.1 11.5*  HCT 41.0 38.2 36.6 35.9*  MCV 90.9 92.7  --  93.0  PLT 248 190  --  163   Basic Metabolic Panel: Recent Labs  Lab 05/04/23 0451  NA 136  K 3.5  CL 103  CO2 22  GLUCOSE 123*  BUN 21  CREATININE 0.95  CALCIUM 9.6   GFR: Estimated Creatinine Clearance: 45.6 mL/min (by C-G formula based on SCr of 0.95 mg/dL). Liver Function Tests: Recent Labs  Lab 05/04/23 0451  AST 26  ALT 26  ALKPHOS 83  BILITOT 0.8  PROT 6.5  ALBUMIN 3.9   Radiology Studies: DG HIP UNILAT WITH PELVIS 2-3 VIEWS LEFT Result Date: 05/05/2023 CLINICAL DATA:  Hip fracture.  ORIF EXAM: DG HIP (WITH OR WITHOUT PELVIS) 3V LEFT COMPARISON:  Preop x-ray 05/04/2023 FINDINGS:  Osteopenia. Interval placement of a left hip hemiarthroplasty with Press-Fit components. Expected alignment. Lateral soft tissue gas. No additional fracture or dislocation. Mild degenerative changes of the right hip and sacroiliac joints. Imaging was obtained to aid in treatment. Scattered presumed vascular calcifications along the pelvis. IMPRESSION: Interval acute surgical changes of left hip hemiarthroplasty. Electronically Signed   By: Karen Kays M.D.   On: 05/05/2023 12:04   Scheduled Meds:  budesonide (PULMICORT) nebulizer  solution  0.25 mg Nebulization BID   Chlorhexidine Gluconate Cloth  6 each Topical Daily   cholecalciferol  4,000 Units Oral Daily   heparin  5,000 Units Subcutaneous Q8H   levothyroxine  137 mcg Oral Q0600   polyethylene glycol  17 g Oral Daily   senna-docusate  2 tablet Oral QHS   sodium chloride flush  3 mL Intravenous Q12H   sodium chloride flush  3 mL Intravenous Q12H   umeclidinium bromide  1 puff Inhalation Daily   Continuous Infusions:   LOS: 2 days   Shon Hale M.D on 05/06/2023 at 6:48 PM  Go to [email or web address] - for contact info  [facility] - Office  [phone]  If 7PM-7AM, please contact night-coverage [email or web address] 05/06/2023, 6:48 PM

## 2023-05-06 NOTE — Evaluation (Signed)
 Occupational Therapy Evaluation Patient Details Name: Melanie Butler MRN: [id number] DOB: 1945/06/28 Today's Date: 05/06/2023   History of Present Illness   Melanie Butler is a 78 y.o. female with medical history significant for chronic asthma, HLD and hypothyroidism who presents from home with left hip pain after mechanical fall from tripping over a rug  -Additional history obtained from patient's son Melanie Butler at bedside  -No chest pains no palpitations no head injury no loss of consciousness or dizziness before or after fall  -No fever  Or chills      No Nausea, Vomiting or Diarrhea     Clinical Impressions Pt agreeable to OT and PT co-evaluation. Pt was independent prior to her fall. Pt required mod A for bed mobility and transfer to chair with RW. Good B UE strength. Max A for lower body dressing due to precautions and limited range. Pt is able to do seated upper body ADL's. Pt left in the chair with call bell within reach. Pt removed from supplemental O2 at start of session without desaturation noted. Pt will benefit from continued OT in the hospital and recommended venue below to increase strength, balance, and endurance for safe ADL's.        If plan is discharge home, recommend the following:   A lot of help with walking and/or transfers;A lot of help with bathing/dressing/bathroom;Assistance with cooking/housework;Assist for transportation;Help with stairs or ramp for entrance     Functional Status Assessment   Patient has had a recent decline in their functional status and demonstrates the ability to make significant improvements in function in a reasonable and predictable amount of time.     Equipment Recommendations   None recommended by OT             Precautions/Restrictions   Precautions Precautions: Posterior Hip;Fall Precaution Booklet Issued: Yes (comment) Recall of Precautions/Restrictions: Intact Restrictions Weight Bearing Restrictions Per Provider Order:  Yes LLE Weight Bearing Per Provider Order: Weight bearing as tolerated Other Position/Activity Restrictions: Adduction pillow donned at start of session     Mobility Bed Mobility Overal bed mobility: Needs Assistance Bed Mobility: Supine to Sit     Supine to sit: Mod assist     General bed mobility comments: Assist to move B LE to EOB; Mild assist to flex trunk to sit at EOB.    Transfers Overall transfer level: Needs assistance Equipment used: Rolling walker (2 wheels) Transfers: Sit to/from Stand, Bed to chair/wheelchair/BSC Sit to Stand: Mod assist     Step pivot transfers: Min assist     General transfer comment: slow labored movement; difficulty sliding L LE to R side.      Balance Overall balance assessment: Needs assistance Sitting-balance support: Feet supported, No upper extremity supported Sitting balance-Leahy Scale: Fair Sitting balance - Comments: Fair-good sitting balance EOB   Standing balance support: Bilateral upper extremity supported, During functional activity, Reliant on assistive device for balance Standing balance-Leahy Scale: Poor Standing balance comment: Poor to fair with RW                           ADL either performed or assessed with clinical judgement   ADL Overall ADL's : Needs assistance/impaired     Grooming: Set up;Sitting   Upper Body Bathing: Set up;Sitting   Lower Body Bathing: Maximal assistance;Sitting/lateral leans   Upper Body Dressing : Set up;Sitting   Lower Body Dressing: Maximal assistance;Bed level Lower Body Dressing Details (indicate  cue type and reason): Assisted for donning socks while supine in bed. Pt has posterior hip precautions. Toilet Transfer: Moderate assistance;Maximal assistance;Stand-pivot;Rolling walker (2 wheels) Toilet Transfer Details (indicate cue type and reason): Simulated via EOB to chair with RW Toileting- Clothing Manipulation and Hygiene: Moderate assistance;Maximal  assistance;Sitting/lateral lean               Vision Baseline Vision/History: 1 Wears glasses Ability to See in Adequate Light: 1 Impaired Patient Visual Report: No change from baseline Vision Assessment?: No apparent visual deficits     Perception Perception: Not tested       Praxis Praxis: Not tested       Pertinent Vitals/Pain Pain Assessment Pain Assessment: 0-10 Pain Score: 4  Pain Location: L Hip Pain Descriptors / Indicators: Grimacing, Guarding Pain Intervention(s): Limited activity within patient's tolerance, Monitored during session, Repositioned, Premedicated before session     Extremity/Trunk Assessment Upper Extremity Assessment Upper Extremity Assessment: Overall WFL for tasks assessed   Lower Extremity Assessment Lower Extremity Assessment: Defer to PT evaluation (Simultaneous filing. User may not have seen previous data.) RLE Deficits / Details: 4-/5 knee ext, 4-/5 ankle DF, 4-/5 hip flexion, may be limited 2/2 pain on L when testing R. RLE Coordination: decreased gross motor LLE Deficits / Details: Limited movement of LLE 2/2 precautions and weakness post-operatively. Patient able to bear minimal weight w/ inc of pain LLE: Unable to fully assess due to pain LLE Coordination: decreased gross motor   Cervical / Trunk Assessment Cervical / Trunk Assessment: Normal   Communication Communication Communication: No apparent difficulties Factors Affecting Communication: Hearing impaired   Cognition Arousal: Alert Behavior During Therapy: WFL for tasks assessed/performed Cognition: No apparent impairments                               Following commands: Intact       Cueing  General Comments   Cueing Techniques: Verbal cues                 Home Living Family/patient expects to be discharged to:: Private residence Living Arrangements: Spouse/significant other;Children Available Help at Discharge: Family;Available  PRN/intermittently Type of Home: House Home Access: Stairs to enter [employer] of Steps: 1 Entrance Stairs-Rails: None Home Layout: One level     Bathroom Shower/Tub: Producer, television/film/video: Handicapped height Bathroom Accessibility: Yes How Accessible: Accessible via walker Home Equipment: Grab bars - tub/shower;Rolling Walker (2 wheels);Standard Walker;Cane - single point   Additional Comments: Reports son and husband can physically A her once home      Prior Functioning/Environment Prior Level of Function : Independent/Modified Independent             Mobility Comments: Community ambulator without AD ADLs Comments: Independent ADL's ; assist with cleaning.    OT Problem List: Decreased strength;Decreased activity tolerance;Impaired balance (sitting and/or standing);Pain   OT Treatment/Interventions: Self-care/ADL training;Therapeutic exercise;Therapeutic activities;Patient/family education;Balance training;DME and/or AE instruction      OT Goals(Current goals can be found in the care plan section)   Acute Rehab OT Goals Patient Stated Goal: improve function OT Goal Formulation: With patient Time For Goal Achievement: 05/20/23 Potential to Achieve Goals: Good   OT Frequency:  Min 2X/week    Co-evaluation PT/OT/SLP Co-Evaluation/Treatment: Yes Reason for Co-Treatment: To address functional/ADL transfers PT goals addressed during session: Mobility/safety with mobility OT goals addressed during session: ADL's and self-care  End of Session Equipment Utilized During Treatment: Rolling walker (2 wheels)  Activity Tolerance: Patient tolerated treatment well Patient left: in chair;with call bell/phone within reach  OT Visit Diagnosis: Unsteadiness on feet (R26.81);Other abnormalities of gait and mobility (R26.89);Muscle weakness (generalized) (M62.81);History of falling (Z91.81)                Time:  1610-9604 OT Time Calculation (min): 32 min Charges:  OT General Charges $OT Visit: 1 Visit OT Evaluation $OT Eval Low Complexity: 1 Low  Gayla Benn OT, MOT  Danie Chandler 05/06/2023, 11:21 AM

## 2023-05-06 NOTE — Evaluation (Signed)
 Physical Therapy Evaluation Patient Details Name: Melanie Butler MRN: [id number] DOB: November 04, 1945 Today's Date: 05/06/2023  History of Present Illness  Melanie Butler is a 78 y.o. female with medical history significant for chronic asthma, HLD and hypothyroidism who presents from home with left hip pain after mechanical fall from tripping over a rug  -Additional history obtained from patient's son Kathlene November at bedside  -No chest pains no palpitations no head injury no loss of consciousness or dizziness before or after fall  -No fever  Or chills      No Nausea, Vomiting or Diarrhea    Clinical Impression  Patient is agreeable to PT/OT evaluation. Received in supine in bed with adduction pillow and 2 Lpm [place]. Throughout entire session patient is on RA and patient SpO2 remains above 91% during all mobility. Posterior hip precautions are reviewed and patient verbalizes understanding. At baseline, patient reports being independent with mobility and ADLs prior to recent fall and having good support at home. On this date, patient requires moderate A for bed mobility, and functional transfers with an AD. Patient is limited by pain and demonstrates left sided weakness, decreased ROM, impaired balance/coordination and decreased endurance which she will benefit from continued skilled physical therapy acutely and in the below recommended venue in order to return home safely.       If plan is discharge home, recommend the following: A lot of help with bathing/dressing/bathroom;A lot of help with walking and/or transfers;Assistance with cooking/housework   Can travel by private vehicle   No    Equipment Recommendations None recommended by PT  Recommendations for Other Services       Functional Status Assessment Patient has had a recent decline in their functional status and demonstrates the ability to make significant improvements in function in a reasonable and predictable amount of time.     Precautions /  Restrictions Precautions Precautions: Posterior Hip;Fall Precaution Booklet Issued: Yes (comment) Recall of Precautions/Restrictions: Intact Precaution/Restrictions Comments: Pt given print out of post hip precautions and copy hung in room Restrictions Weight Bearing Restrictions Per Provider Order: No Other Position/Activity Restrictions: Adduction pillow donned at start of session      Mobility  Bed Mobility Overal bed mobility: Needs Assistance Bed Mobility: Supine to Sit     Supine to sit: Mod assist     General bed mobility comments: HOB flat, Use of railings and Mod A needed for LLE handling at ankle and trunk 2/2 pain and safety precautions.    Transfers Overall transfer level: Needs assistance Equipment used: Rolling walker (2 wheels) Transfers: Sit to/from Stand, Bed to chair/wheelchair/BSC Sit to Stand: Mod assist Stand pivot transfers: Min assist Step pivot transfers: Min assist       General transfer comment: Mod A for STS 2/2 weakness, Min A for bed>chair for steadying. Verbal cueing to inc UE WB t/o to limit pain. A needed for RW management at times. Inc time needed.    Ambulation/Gait               General Gait Details: Unable to formally assess safely on this date  Stairs            Wheelchair Mobility     Tilt Bed    Modified Rankin (Stroke Patients Only)       Balance Overall balance assessment: Needs assistance Sitting-balance support: Feet supported, No upper extremity supported Sitting balance-Leahy Scale: Fair Sitting balance - Comments: Fair-good sitting balance EOB   Standing balance support: Bilateral  upper extremity supported, During functional activity, Reliant on assistive device for balance Standing balance-Leahy Scale: Fair Standing balance comment: Reliant on RW and min A for steadying                             Pertinent Vitals/Pain Pain Assessment Pain Assessment: 0-10 Pain Score: 4  Pain  Location: L Hip Pain Intervention(s): Limited activity within patient's tolerance, Monitored during session, Repositioned, Ice applied    Home Living Family/patient expects to be discharged to:: Private residence Living Arrangements: Spouse/significant other;Children Available Help at Discharge: Family;Available PRN/intermittently Type of Home: House Home Access: Stairs to enter Entrance Stairs-Rails: None Entrance Stairs-Number of Steps: 1   Home Layout: One level Home Equipment: Grab bars - tub/shower;Rolling Walker (2 wheels);Standard Walker;Cane - single point Additional Comments: Reports son and husband can physically A her once home    Prior Function Prior Level of Function : Independent/Modified Independent             Mobility Comments: Community ambulator without AD ADLs Comments: Independent ADL's ; assist with cleaning.     Extremity/Trunk Assessment   Upper Extremity Assessment Upper Extremity Assessment: Overall WFL for tasks assessed (Simultaneous filing. User may not have seen previous data.)    Lower Extremity Assessment Lower Extremity Assessment: Defer to PT evaluation (Simultaneous filing. User may not have seen previous data.) RLE Deficits / Details: 4-/5 knee ext, 4-/5 ankle DF, 4-/5 hip flexion, may be limited 2/2 pain on L when testing R. RLE Coordination: decreased gross motor LLE Deficits / Details: Limited movement of LLE 2/2 precautions and weakness post-operatively. Patient able to bear minimal weight w/ inc of pain LLE: Unable to fully assess due to pain LLE Coordination: decreased gross motor    Cervical / Trunk Assessment Cervical / Trunk Assessment: Normal (Simultaneous filing. User may not have seen previous data.)  Communication   Communication Communication: No apparent difficulties Factors Affecting Communication: Hearing impaired    Cognition Arousal: Alert Behavior During Therapy: WFL for tasks assessed/performed   PT -  Cognitive impairments: No apparent impairments                         Following commands: Intact       Cueing Cueing Techniques: Verbal cues     General Comments      Exercises     Assessment/Plan    PT Assessment Patient needs continued PT services;All further PT needs can be met in the next venue of care  PT Problem List Decreased strength;Decreased range of motion;Decreased activity tolerance;Decreased balance;Decreased mobility;Decreased coordination;Pain       PT Treatment Interventions DME instruction;Gait training;Stair training;Functional mobility training;Therapeutic activities;Therapeutic exercise;Balance training;Patient/family education    PT Goals (Current goals can be found in the Care Plan section)  Acute Rehab PT Goals Patient Stated Goal: Return home safely with appropriate assist PT Goal Formulation: With patient Time For Goal Achievement: 05/13/23 Potential to Achieve Goals: Good    Frequency Min 3X/week     Co-evaluation PT/OT/SLP Co-Evaluation/Treatment: Yes Reason for Co-Treatment: To address functional/ADL transfers PT goals addressed during session: Mobility/safety with mobility OT goals addressed during session: ADL's and self-care       AM-PAC PT "6 Clicks" Mobility  Outcome Measure Help needed turning from your back to your side while in a flat bed without using bedrails?: A Lot Help needed moving from lying on your back to sitting on  the side of a flat bed without using bedrails?: A Lot Help needed moving to and from a bed to a chair (including a wheelchair)?: A Lot Help needed standing up from a chair using your arms (e.g., wheelchair or bedside chair)?: A Lot Help needed to walk in hospital room?: A Lot Help needed climbing 3-5 steps with a railing? : A Lot 6 Click Score: 12    End of Session Equipment Utilized During Treatment: Gait belt Activity Tolerance: Patient limited by pain Patient left: in chair;with call  bell/phone within reach Nurse Communication: Mobility status PT Visit Diagnosis: Unsteadiness on feet (R26.81);Difficulty in walking, not elsewhere classified (R26.2);Other abnormalities of gait and mobility (R26.89);Muscle weakness (generalized) (M62.81);History of falling (Z91.81);Pain Pain - Right/Left: Left Pain - part of body: Hip    Time: 1610-9604 PT Time Calculation (min) (ACUTE ONLY): 29 min   Charges:   PT Evaluation $PT Eval Moderate Complexity: 1 Mod PT Treatments $Therapeutic Activity: 23-37 mins PT General Charges $$ ACUTE PT VISIT: 1 Visit        11:21 AM, 05/06/23 Chryl Heck, PT, DPT [place] with [facility]

## 2023-05-06 NOTE — Plan of Care (Signed)
  Problem: Acute Rehab OT Goals (only OT should resolve) Goal: Pt. Will Perform Grooming Flowsheets (Taken 05/06/2023 1127) Pt Will Perform Grooming: with mod assist Goal: Pt. Will Perform Lower Body Bathing Flowsheets (Taken 05/06/2023 1127) Pt Will Perform Lower Body Bathing:  with mod assist  with min assist  with adaptive equipment Goal: Pt. Will Perform Lower Body Dressing Flowsheets (Taken 05/06/2023 1127) Pt Will Perform Lower Body Dressing:  with min assist  with mod assist  with adaptive equipment Goal: Pt. Will Transfer To Toilet Flowsheets (Taken 05/06/2023 1127) Pt Will Transfer to Toilet:  with contact guard assist  stand pivot transfer Goal: Pt. Will Perform Toileting-Clothing Manipulation Flowsheets (Taken 05/06/2023 1127) Pt Will Perform Toileting - Clothing Manipulation and hygiene:  with min assist  sitting/lateral leans  Sonita Michiels OT, MOT

## 2023-05-06 NOTE — Plan of Care (Signed)
  Problem: Acute Rehab PT Goals(only PT should resolve) Goal: Pt Will Go Supine/Side To Sit Outcome: Progressing Flowsheets (Taken 05/06/2023 1123) Pt will go Supine/Side to Sit: with contact guard assist Goal: Patient Will Transfer Sit To/From Stand Outcome: Progressing Flowsheets (Taken 05/06/2023 1123) Patient will transfer sit to/from stand: with contact guard assist Goal: Pt Will Transfer Bed To Chair/Chair To Bed Outcome: Progressing Flowsheets (Taken 05/06/2023 1123) Pt will Transfer Bed to Chair/Chair to Bed: with contact guard assist Goal: Pt Will Ambulate Outcome: Progressing Flowsheets (Taken 05/06/2023 1123) Pt will Ambulate:  10 feet  with rolling walker  with minimal assist   11:24 AM, 05/06/23 Chryl Heck, PT, DPT [place] with [facility]

## 2023-05-06 NOTE — Progress Notes (Addendum)
 Mobility Specialist Progress Note:    05/06/23 1104  Mobility  Activity Transferred from chair to bed  Level of Assistance Maximum assist, patient does 25-49%  Assistive Device Front wheel walker  Distance Ambulated (ft) 2 ft  Range of Motion/Exercises Active;Right arm;Right leg;Left arm;Active Assistive;Left leg  Activity Response Tolerated well  Mobility visit 1 Mobility  Mobility Specialist Start Time (ACUTE ONLY) 1040  Mobility Specialist Stop Time (ACUTE ONLY) 1055  Mobility Specialist Time Calculation (min) (ACUTE ONLY) 15 min   Pt received requesting assistance to bed. Required MaxA to stand and transfer with RW. Tolerated well, LLE WBAT. Rated pain 10/10. Left pt supine, family at bedside. All needs met.  Lawerance Bach Mobility Specialist Please contact via Special educational needs teacher or  Rehab office at [phone]

## 2023-05-06 NOTE — Progress Notes (Signed)
 Subjective: 1 Day Post-Op Procedure(s) (LRB): ARTHROPLASTY, HIP, TOTAL,POSTERIOR APPROACH (Left) Patient reports pain as moderate.    Objective: Vital signs in last 24 hours: Temp:  [97.5 F (36.4 C)-99 F (37.2 C)] 98.4 F (36.9 C) (03/07 0508) Pulse Rate:  [75-85] 78 (03/07 0508) Resp:  [16-24] 18 (03/07 0508) BP: (94-138)/(44-70) 126/60 (03/07 0508) SpO2:  [91 %-99 %] 97 % (03/07 0508)  Intake/Output from previous day: 03/06 0701 - 03/07 0700 In: 1740 [P.O.:540; I.V.:800; IV Piggyback:400] Out: 1250 [Urine:1100; Blood:150] Intake/Output this shift: No intake/output data recorded.  Recent Labs    05/04/23 0451 05/05/23 0314 05/05/23 1052 05/06/23 0259  HGB 13.7 12.6 12.1 11.5*   Recent Labs    05/05/23 0314 05/05/23 1052 05/06/23 0259  WBC 9.0  --  11.2*  RBC 4.12  --  3.86*  HCT 38.2 36.6 35.9*  PLT 190  --  163   Recent Labs    05/04/23 0451  NA 136  K 3.5  CL 103  CO2 22  BUN 21  CREATININE 0.95  GLUCOSE 123*  CALCIUM 9.6   No results for input(s): "LABPT", "INR" in the last 72 hours.  Neurologically intact Neurovascular intact Sensation intact distally Intact pulses distally Dorsiflexion/Plantar flexion intact Incision: no drainage   Assessment/Plan: 1 Day Post-Op Procedure(s) (LRB): ARTHROPLASTY, HIP, TOTAL,POSTERIOR APPROACH (Left) Advance diet Up with therapy      Latest Ref Rng & Units 05/06/2023    2:59 AM 05/05/2023   10:52 AM 05/05/2023    3:14 AM  CBC  WBC 4.0 - 10.5 K/uL 11.2   9.0   Hemoglobin 12.0 - 15.0 g/dL 78.4  69.6  29.5   Hematocrit 36.0 - 46.0 % 35.9  36.6  38.2   Platelets 150 - 400 K/uL 163   190       Diem Pagnotta 05/06/2023, 8:00 AM

## 2023-05-07 DIAGNOSIS — S72002A Fracture of unspecified part of neck of left femur, initial encounter for closed fracture: Secondary | ICD-10-CM | POA: Diagnosis not present

## 2023-05-07 NOTE — Plan of Care (Signed)
  Problem: Education: Goal: Knowledge of General Education information will improve Description: Including pain rating scale, medication(s)/side effects and non-pharmacologic comfort measures Outcome: Progressing   Problem: Clinical Measurements: Goal: Ability to maintain clinical measurements within normal limits will improve Outcome: Progressing   Problem: Elimination: Goal: Will not experience complications related to bowel motility Outcome: Progressing   Problem: Pain Managment: Goal: General experience of comfort will improve and/or be controlled Outcome: Progressing

## 2023-05-07 NOTE — Progress Notes (Signed)
 PROGRESS NOTE  Melanie Butler, is a 78 y.o. female, DOB - Jul 19, 1945, ZOX:[id number]  Admit date - 05/04/2023   Admitting Physician Briscoe Deutscher, MD  Outpatient Primary MD for the patient is Laurann Montana, MD  LOS - 3  Chief Complaint  Patient presents with   Fall   Hip Pain        Brief Narrative:  Cristan Hout Lutz is a 78 y.o. female with medical history significant for chronic asthma, HLD and hypothyroidism who presents from home with left hip pain after mechanical fall from tripping over a rug .  She was admitted on 05/04/2023 with left hip fracture, underwent ORIF on 05/05/2023  05/07/23 -Patient remains medically stable for transfer to SNF rehab when SNF bed is available and insurance authorization can be obtained   -Assessment and Plan: 1)LT Hip Fx ---Impacted and otherwise nondisplaced subcapital transverse fracture of the proximal left femur. -s/p ORIF on 05/05/23 --IV fentanyl and oral oxycodone for pain control along with methocarbamol -Post-operative plan:  The patient will be WBAT on the operative extremity. Hip abduction pillow in place at all times when in bed Posterior hip precautions PT/OT    DVT prophylaxis with subcu Heparin while inpatient and Recommend 81 mg of aspirin BID x 6 weeks postdischarge Orthopedic Follow up plan will be scheduled in approximately 14 days for incision check and XR.   2)Asthma/Emphysema--- no acute exacerbation at this time  -c/n bronchodilators   3)HLD-hold statin due to trauma/hip fracture with risk for elevated CKs   4)Hypothyroidism--continue levothyroxine  Status is: Inpatient   Disposition: The patient is from: Home              Anticipated d/c is to: SNF              Anticipated d/c date is: 1 day              Patient currently is medically stable to d/c. Barriers: -Patient remains medically stable for transfer to SNF rehab when SNF bed is available and insurance authorization can be obtained    Code Status :  -  Code Status:  Full Code   Family Communication:    (patient is alert, awake and coherent)  Discussed with Son Kathlene November and Husband  DVT Prophylaxis  :   - SCDs   heparin injection 5,000 Units Start: 05/06/23 2200 SCDs Start: 05/04/23 0817 Place TED hose Start: 05/04/23 0817  Lab Results  Component Value Date   PLT 163 05/06/2023   Inpatient Medications  Scheduled Meds:  budesonide (PULMICORT) nebulizer solution  0.25 mg Nebulization BID   Chlorhexidine Gluconate Cloth  6 each Topical Daily   cholecalciferol  4,000 Units Oral Daily   heparin  5,000 Units Subcutaneous Q8H   levothyroxine  137 mcg Oral Q0600   polyethylene glycol  17 g Oral Daily   senna-docusate  2 tablet Oral QHS   sodium chloride flush  3 mL Intravenous Q12H   sodium chloride flush  3 mL Intravenous Q12H   umeclidinium bromide  1 puff Inhalation Daily   Continuous Infusions:   PRN Meds:.acetaminophen **OR** acetaminophen, albuterol, bisacodyl, fentaNYL (SUBLIMAZE) injection, ondansetron **OR** ondansetron (ZOFRAN) IV, oxyCODONE, sodium chloride flush, traZODone   Anti-infectives (From admission, onward)    Start     Dose/Rate Route Frequency Ordered Stop   05/05/23 1400  ceFAZolin (ANCEF) IVPB 2g/100 mL premix        2 g 200 mL/hr over 30 Minutes Intravenous Every 8 hours 05/05/23  1147 05/06/23 0538   05/05/23 0937  vancomycin (VANCOCIN) powder  Status:  Discontinued          As needed 05/05/23 1914 05/05/23 1138   05/05/23 0700  ceFAZolin (ANCEF) IVPB 2g/100 mL premix        2 g 200 mL/hr over 30 Minutes Intravenous On call to O.R. 05/04/23 1319 05/05/23 0825        Subjective: Oliwia Bache today has no fevers, no emesis,  No chest pain,   -  -Husband and son at bedside, questions answered -- Pain control improving -Ambulated with physical therapist -Eating and drinking well -Voiding and had BM  05/07/23 -Patient remains medically stable for transfer to SNF rehab when SNF bed is available and insurance  authorization can be obtained    Objective: Vitals:   05/06/23 1927 05/07/23 0411 05/07/23 0846 05/07/23 1224  BP:  (!) 125/56  (!) 105/49  Pulse:  74  80  Resp:  18  20  Temp:  98.1 F (36.7 C)  98.8 F (37.1 C)  TempSrc:  Oral  Axillary  SpO2: 91% 92% 94% 94%  Weight:      Height:        Intake/Output Summary (Last 24 hours) at 05/07/2023 1708 Last data filed at 05/07/2023 1226 Gross per 24 hour  Intake 220 ml  Output 400 ml  Net -180 ml   Filed Weights   05/04/23 0947 05/04/23 1654  Weight: 72.6 kg 72 kg    Physical Exam Gen:- Awake Alert, in no acute distress HEENT:- [place].AT, No sclera icterus Neck-Supple Neck,No JVD,.  Lungs-  CTAB , fair symmetrical air movement CV- S1, S2 normal, regular  Abd-  +ve B.Sounds, Abd Soft, No tenderness,    Extremity/Skin:- No  edema, pedal pulses present  Psych-affect is appropriate, oriented x3 Neuro-generalized weakness, no new focal deficits, no tremors MSK-Left hip postop wound dry and intact GU--Foley  removed on 05/06/2023  Data Reviewed: I have personally reviewed following labs and imaging studies  CBC: Recent Labs  Lab 05/04/23 0451 05/05/23 0314 05/05/23 1052 05/06/23 0259  WBC 17.0* 9.0  --  11.2*  NEUTROABS 14.4*  --   --   --   HGB 13.7 12.6 12.1 11.5*  HCT 41.0 38.2 36.6 35.9*  MCV 90.9 92.7  --  93.0  PLT 248 190  --  163   Basic Metabolic Panel: Recent Labs  Lab 05/04/23 0451  NA 136  K 3.5  CL 103  CO2 22  GLUCOSE 123*  BUN 21  CREATININE 0.95  CALCIUM 9.6   GFR: Estimated Creatinine Clearance: 45.6 mL/min (by C-G formula based on SCr of 0.95 mg/dL). Liver Function Tests: Recent Labs  Lab 05/04/23 0451  AST 26  ALT 26  ALKPHOS 83  BILITOT 0.8  PROT 6.5  ALBUMIN 3.9   Radiology Studies: No results found.  Scheduled Meds:  budesonide (PULMICORT) nebulizer solution  0.25 mg Nebulization BID   Chlorhexidine Gluconate Cloth  6 each Topical Daily   cholecalciferol  4,000 Units Oral Daily    heparin  5,000 Units Subcutaneous Q8H   levothyroxine  137 mcg Oral Q0600   polyethylene glycol  17 g Oral Daily   senna-docusate  2 tablet Oral QHS   sodium chloride flush  3 mL Intravenous Q12H   sodium chloride flush  3 mL Intravenous Q12H   umeclidinium bromide  1 puff Inhalation Daily   Continuous Infusions:   LOS: 3 days   Anant Agard  M.D on 05/07/2023 at 5:08 PM  Go to [email or web address] - for contact info  [facility] - Office  [phone]  If 7PM-7AM, please contact night-coverage [email or web address] 05/07/2023, 5:08 PM

## 2023-05-07 NOTE — Plan of Care (Signed)

## 2023-05-07 NOTE — Progress Notes (Signed)
 Physical Therapy Treatment Patient Details Name: Melanie Butler MRN: [id number] DOB: 1945/09/27 Today's Date: 05/07/2023   History of Present Illness Melanie Butler is a 78 y.o. female s/p Left Hip hemiarthroplasty on 05/05/23, with medical history significant for chronic asthma, HLD and hypothyroidism who presents from home with left hip pain after mechanical fall from tripping over a rug  -Additional history obtained from patient's son Melanie Butler at bedside  -No chest pains no palpitations no head injury no loss of consciousness or dizziness before or after fall  -No fever  Or chills      No Nausea, Vomiting or Diarrhea    PT Comments  Patient presents seated in chair (assisted by nursing staff and agreeable for therapy. Patient requires active assistance for completing most exercises with LLE due to weakness and pain, limited to a couple of steps forward/backward with poor tolerance for weightbearing on LLE  and tolerated staying up in chair after therapy. Patient will benefit from continued skilled physical therapy in hospital and recommended venue below to increase strength, balance, endurance for safe ADLs and gait.     If plan is discharge home, recommend the following: A lot of help with bathing/dressing/bathroom;A lot of help with walking and/or transfers;Assistance with cooking/housework;Help with stairs or ramp for entrance   Can travel by private vehicle     No  Equipment Recommendations  None recommended by PT    Recommendations for Other Services       Precautions / Restrictions Precautions Precautions: Posterior Hip;Fall Precaution Booklet Issued: Yes (comment) Recall of Precautions/Restrictions: Intact Restrictions Weight Bearing Restrictions Per Provider Order: Yes LLE Weight Bearing Per Provider Order: Weight bearing as tolerated     Mobility  Bed Mobility               General bed mobility comments: Patient presents seated in chair (assisted by nursing staff)     Transfers Overall transfer level: Needs assistance Equipment used: Rolling walker (2 wheels) Transfers: Sit to/from Stand Sit to Stand: Mod assist           General transfer comment: increased time, labored movement with limited weightbearing on LLE due to increasing pain    Ambulation/Gait Ambulation/Gait assistance: Mod assist, Max assist Gait Distance (Feet): 2 Feet Assistive device: Rolling walker (2 wheels) Gait Pattern/deviations: Decreased step length - right, Decreased step length - left, Decreased stride length, Shuffle Gait velocity: slow     General Gait Details: limited to a couple of slow labored shuffling steps forward/backward before having to sit due to weakness   Stairs             Wheelchair Mobility     Tilt Bed    Modified Rankin (Stroke Patients Only)       Balance Overall balance assessment: Needs assistance Sitting-balance support: Feet supported, No upper extremity supported Sitting balance-Leahy Scale: Fair Sitting balance - Comments: seated at edge chair   Standing balance support: Reliant on assistive device for balance, During functional activity, Bilateral upper extremity supported Standing balance-Leahy Scale: Poor Standing balance comment: using RW                            Communication Communication Communication: No apparent difficulties Factors Affecting Communication: Hearing impaired  Cognition Arousal: Alert Behavior During Therapy: WFL for tasks assessed/performed   PT - Cognitive impairments: No apparent impairments  Following commands: Intact      Cueing Cueing Techniques: Verbal cues  Exercises General Exercises - Lower Extremity Long Arc Quad: Seated, AROM, AAROM, Strengthening, Both, 10 reps Hip Flexion/Marching: Seated, AROM, AAROM, Strengthening, Both, 10 reps Toe Raises: Seated, AROM, Strengthening, Both, 10 reps Heel Raises: Seated, AROM,  Strengthening, Both, 10 reps    General Comments        Pertinent Vitals/Pain Pain Assessment Pain Assessment: Faces Faces Pain Scale: Hurts even more Pain Location: left hip Pain Descriptors / Indicators: Grimacing, Guarding, Sore, Sharp Pain Intervention(s): Limited activity within patient's tolerance, Monitored during session, Premedicated before session, Repositioned    Home Living                          Prior Function            PT Goals (current goals can now be found in the care plan section) Acute Rehab PT Goals Patient Stated Goal: return home after rehab PT Goal Formulation: With patient/family Time For Goal Achievement: 05/13/23 Potential to Achieve Goals: Good Progress towards PT goals: Progressing toward goals    Frequency    Min 3X/week      PT Plan      Co-evaluation              AM-PAC PT "6 Clicks" Mobility   Outcome Measure  Help needed turning from your back to your side while in a flat bed without using bedrails?: A Lot Help needed moving from lying on your back to sitting on the side of a flat bed without using bedrails?: A Lot Help needed moving to and from a bed to a chair (including a wheelchair)?: A Lot Help needed standing up from a chair using your arms (e.g., wheelchair or bedside chair)?: A Lot Help needed to walk in hospital room?: A Lot Help needed climbing 3-5 steps with a railing? : Total 6 Click Score: 11    End of Session   Activity Tolerance: Patient tolerated treatment well;Patient limited by fatigue Patient left: in chair;with call bell/phone within reach Nurse Communication: Mobility status PT Visit Diagnosis: Unsteadiness on feet (R26.81);Difficulty in walking, not elsewhere classified (R26.2);Other abnormalities of gait and mobility (R26.89);Muscle weakness (generalized) (M62.81);History of falling (Z91.81);Pain Pain - Right/Left: Left Pain - part of body: Hip     Time: 1111-1141 PT Time  Calculation (min) (ACUTE ONLY): 30 min  Charges:    $Therapeutic Exercise: 8-22 mins $Therapeutic Activity: 8-22 mins PT General Charges $$ ACUTE PT VISIT: 1 Visit                     1:04 PM, 05/07/23 Ocie Bob, MPT Physical Therapist with [facility] [phone] office [phone] mobile phone

## 2023-05-08 DIAGNOSIS — S72002A Fracture of unspecified part of neck of left femur, initial encounter for closed fracture: Secondary | ICD-10-CM | POA: Diagnosis not present

## 2023-05-08 LAB — CBC
HCT: 32.6 % — ABNORMAL LOW (ref 36.0–46.0)
Hemoglobin: 10.6 g/dL — ABNORMAL LOW (ref 12.0–15.0)
MCH: 30.4 pg (ref 26.0–34.0)
MCHC: 32.5 g/dL (ref 30.0–36.0)
MCV: 93.4 fL (ref 80.0–100.0)
Platelets: 168 10*3/uL (ref 150–400)
RBC: 3.49 MIL/uL — ABNORMAL LOW (ref 3.87–5.11)
RDW: 13.8 % (ref 11.5–15.5)
WBC: 8.3 10*3/uL (ref 4.0–10.5)
nRBC: 0 % (ref 0.0–0.2)

## 2023-05-08 MED ORDER — POLYETHYLENE GLYCOL 3350 17 G PO PACK
17.0000 g | PACK | Freq: Two times a day (BID) | ORAL | Status: DC
  Administered 2023-05-08 – 2023-05-09 (×2): 17 g via ORAL
  Filled 2023-05-08 (×2): qty 1

## 2023-05-08 NOTE — Progress Notes (Signed)
 PROGRESS NOTE  Melanie Butler, is a 78 y.o. female, DOB - 09/17/1945, YQM:[id number]  Admit date - 05/04/2023   Admitting Physician Briscoe Deutscher, MD  Outpatient Primary MD for the patient is Laurann Montana, MD  LOS - 4  Chief Complaint  Patient presents with   Fall   Hip Pain        Brief Narrative:  Melanie Butler is a 78 y.o. female with medical history significant for chronic asthma, HLD and hypothyroidism who presents from home with left hip pain after mechanical fall from tripping over a rug .  She was admitted on 05/04/2023 with left hip fracture, underwent ORIF on 05/05/2023  05/08/23 -Patient remains medically stable for transfer to SNF rehab when SNF bed is available and insurance authorization can be obtained   -Assessment and Plan: 1)LT Hip Fx ---Impacted and otherwise nondisplaced subcapital transverse fracture of the proximal left femur. -s/p ORIF on 05/05/23 --IV fentanyl and oral oxycodone for pain control along with methocarbamol -Post-operative plan:  The patient will be WBAT on the operative extremity. Hip abduction pillow in place at all times when in bed Posterior hip precautions PT/OT    DVT prophylaxis with subcu Heparin while inpatient and Recommend 81 mg of aspirin BID x 6 weeks postdischarge Orthopedic Follow up plan will be scheduled in approximately 14 days for incision check and XR.   2)Asthma/Emphysema--- no acute exacerbation at this time  -c/n bronchodilators   3)HLD-hold statin due to trauma/hip fracture with risk for elevated CKs   4)Hypothyroidism--continue levothyroxine  5) acute anemia--suspect due to perioperative blood loss in setting of Lt Femoral fracture operative fixation of left femoral fracture -- Hgb currently above 10 -Monitor closely and transfuse as clinically indicated  Status is: Inpatient   Disposition: The patient is from: Home              Anticipated d/c is to: SNF              Anticipated d/c date is: 1 day               Patient currently is medically stable to d/c. Barriers: -Patient remains medically stable for transfer to SNF rehab when SNF bed is available and insurance authorization can be obtained    Code Status :  -  Code Status: Full Code   Family Communication:    (patient is alert, awake and coherent)  Discussed with Son Kathlene November and Husband  DVT Prophylaxis  :   - SCDs   heparin injection 5,000 Units Start: 05/06/23 2200 SCDs Start: 05/04/23 0817 Place TED hose Start: 05/04/23 0817  Lab Results  Component Value Date   PLT 168 05/08/2023   Inpatient Medications  Scheduled Meds:  budesonide (PULMICORT) nebulizer solution  0.25 mg Nebulization BID   Chlorhexidine Gluconate Cloth  6 each Topical Daily   cholecalciferol  4,000 Units Oral Daily   heparin  5,000 Units Subcutaneous Q8H   levothyroxine  137 mcg Oral Q0600   polyethylene glycol  17 g Oral Daily   senna-docusate  2 tablet Oral QHS   sodium chloride flush  3 mL Intravenous Q12H   sodium chloride flush  3 mL Intravenous Q12H   umeclidinium bromide  1 puff Inhalation Daily   Continuous Infusions:   PRN Meds:.acetaminophen **OR** acetaminophen, albuterol, bisacodyl, fentaNYL (SUBLIMAZE) injection, ondansetron **OR** ondansetron (ZOFRAN) IV, oxyCODONE, sodium chloride flush, traZODone   Anti-infectives (From admission, onward)    Start     [employer]  Frequency Ordered Stop   05/05/23 1400  ceFAZolin (ANCEF) IVPB 2g/100 mL premix        2 g 200 mL/hr over 30 Minutes Intravenous Every 8 hours 05/05/23 1147 05/06/23 0538   05/05/23 0937  vancomycin (VANCOCIN) powder  Status:  Discontinued          As needed 05/05/23 1610 05/05/23 1138   05/05/23 0700  ceFAZolin (ANCEF) IVPB 2g/100 mL premix        2 g 200 mL/hr over 30 Minutes Intravenous On call to O.R. 05/04/23 1319 05/05/23 0825        Subjective: Melanie Butler today has no fevers, no emesis,  No chest pain,   -  -Husband at bedside, up in the chair with physical  therapy today -Patient states pain control is improving  05/08/23 -Patient remains medically stable for transfer to SNF rehab when SNF bed is available and insurance authorization can be obtained    Objective: Vitals:   05/07/23 2138 05/08/23 0535 05/08/23 0810 05/08/23 1224  BP: (!) 105/47 (!) 121/47  (!) 115/52  Pulse: 70 66  74  Resp: 18 18  17   Temp: 97.9 F (36.6 C) 97.7 F (36.5 C)  98.5 F (36.9 C)  TempSrc: Axillary Oral    SpO2: 95% 95% 95% 94%  Weight:      Height:        Intake/Output Summary (Last 24 hours) at 05/08/2023 1740 Last data filed at 05/08/2023 1224 Gross per 24 hour  Intake 940 ml  Output 1700 ml  Net -760 ml   Filed Weights   05/04/23 0947 05/04/23 1654  Weight: 72.6 kg 72 kg    Physical Exam Gen:- Awake Alert, in no acute distress HEENT:- [place].AT, No sclera icterus Neck-Supple Neck,No JVD,.  Lungs-  CTAB , fair symmetrical air movement CV- S1, S2 normal, regular  Abd-  +ve B.Sounds, Abd Soft, No tenderness,    Extremity/Skin:- No  edema, pedal pulses present  Psych-affect is appropriate, oriented x3 Neuro-generalized weakness, no new focal deficits, no tremors MSK-Left hip postop wound dry and intact GU--Foley  removed on 05/06/2023  Data Reviewed: I have personally reviewed following labs and imaging studies  CBC: Recent Labs  Lab 05/04/23 0451 05/05/23 0314 05/05/23 1052 05/06/23 0259 05/08/23 0349  WBC 17.0* 9.0  --  11.2* 8.3  NEUTROABS 14.4*  --   --   --   --   HGB 13.7 12.6 12.1 11.5* 10.6*  HCT 41.0 38.2 36.6 35.9* 32.6*  MCV 90.9 92.7  --  93.0 93.4  PLT 248 190  --  163 168   Basic Metabolic Panel: Recent Labs  Lab 05/04/23 0451  NA 136  K 3.5  CL 103  CO2 22  GLUCOSE 123*  BUN 21  CREATININE 0.95  CALCIUM 9.6   GFR: Estimated Creatinine Clearance: 45.6 mL/min (by C-G formula based on SCr of 0.95 mg/dL). Liver Function Tests: Recent Labs  Lab 05/04/23 0451  AST 26  ALT 26  ALKPHOS 83  BILITOT 0.8  PROT  6.5  ALBUMIN 3.9   Radiology Studies: No results found.  Scheduled Meds:  budesonide (PULMICORT) nebulizer solution  0.25 mg Nebulization BID   Chlorhexidine Gluconate Cloth  6 each Topical Daily   cholecalciferol  4,000 Units Oral Daily   heparin  5,000 Units Subcutaneous Q8H   levothyroxine  137 mcg Oral Q0600   polyethylene glycol  17 g Oral Daily   senna-docusate  2 tablet Oral QHS  sodium chloride flush  3 mL Intravenous Q12H   sodium chloride flush  3 mL Intravenous Q12H   umeclidinium bromide  1 puff Inhalation Daily   Continuous Infusions:   LOS: 4 days   Shon Hale M.D on 05/08/2023 at 5:40 PM  Go to [email or web address] - for contact info  [facility] - Office  [phone]  If 7PM-7AM, please contact night-coverage [email or web address] 05/08/2023, 5:40 PM

## 2023-05-08 NOTE — Progress Notes (Signed)
 Physical Therapy Treatment Patient Details Name: Melanie Butler MRN: [id number] DOB: Aug 14, 1945 Today's Date: 05/08/2023   History of Present Illness Melanie Butler is a 78 y.o. female s/p Left Hip hemiarthroplasty on 05/05/23, with medical history significant for chronic asthma, HLD and hypothyroidism who presents from home with left hip pain after mechanical fall from tripping over a rug  -Additional history obtained from patient's son Melanie Butler at bedside  -No chest pains no palpitations no head injury no loss of consciousness or dizziness before or after fall  -No fever  Or chills      No Nausea, Vomiting or Diarrhea    PT Comments  Patient demonstrates improvement for moving LLE, has difficulty propping up onto elbows due to weakness and able to take a few shuffling steps at bedside with less c/o pain and tolerated sitting up in chair after therapy with spouse present. Patient will benefit from continued skilled physical therapy in hospital and recommended venue below to increase strength, balance, endurance for safe ADLs and gait.     If plan is discharge home, recommend the following: A lot of help with bathing/dressing/bathroom;A lot of help with walking and/or transfers;Assistance with cooking/housework;Help with stairs or ramp for entrance   Can travel by private vehicle     No  Equipment Recommendations  None recommended by PT    Recommendations for Other Services       Precautions / Restrictions Precautions Precautions: Posterior Hip;Fall Precaution Booklet Issued: Yes (comment) Recall of Precautions/Restrictions: Intact Precaution/Restrictions Comments: Pt given print out of post hip precautions and copy hung in room Restrictions Weight Bearing Restrictions Per Provider Order: Yes LLE Weight Bearing Per Provider Order: Weight bearing as tolerated Other Position/Activity Restrictions: HIp abduction pillow     Mobility  Bed Mobility Overal bed mobility: Needs Assistance Bed  Mobility: Sit to Supine     Supine to sit: Mod assist     General bed mobility comments: labored movemet with improvement for scooting up to EOB    Transfers Overall transfer level: Needs assistance Equipment used: Rolling walker (2 wheels) Transfers: Sit to/from Stand, Bed to chair/wheelchair/BSC Sit to Stand: Mod assist   Step pivot transfers: Mod assist       General transfer comment: slow labored movement with slight improvement for advancing LLE    Ambulation/Gait Ambulation/Gait assistance: Mod assist, Max assist Gait Distance (Feet): 5 Feet Assistive device: Rolling walker (2 wheels) Gait Pattern/deviations: Decreased step length - right, Decreased step length - left, Decreased stride length, Shuffle, Antalgic, Decreased stance time - left Gait velocity: slow     General Gait Details: slow labored movement with improvement for advancing LLE, but limited due to fatigue and increasing left hip pain   Stairs             Wheelchair Mobility     Tilt Bed    Modified Rankin (Stroke Patients Only)       Balance Overall balance assessment: Needs assistance Sitting-balance support: Feet supported, No upper extremity supported Sitting balance-Leahy Scale: Fair Sitting balance - Comments: fair/good seated EOB   Standing balance support: Reliant on assistive device for balance, During functional activity, Bilateral upper extremity supported Standing balance-Leahy Scale: Poor Standing balance comment: using RW                            Communication Communication Communication: No apparent difficulties Factors Affecting Communication: Hearing impaired  Cognition Arousal: Alert Behavior During Therapy:  WFL for tasks assessed/performed   PT - Cognitive impairments: No apparent impairments                         Following commands: Intact      Cueing Cueing Techniques: Verbal cues  Exercises General Exercises - Lower  Extremity Long Arc Quad: Seated, AROM, AAROM, Strengthening, Both, 10 reps Hip Flexion/Marching: Seated, AROM, AAROM, Strengthening, Both, 10 reps Toe Raises: Seated, AROM, Strengthening, Both, 10 reps    General Comments        Pertinent Vitals/Pain Pain Assessment Pain Assessment: Faces Faces Pain Scale: Hurts little more Pain Location: left hip Pain Descriptors / Indicators: Discomfort, Sore Pain Intervention(s): Limited activity within patient's tolerance, Monitored during session, Repositioned    Home Living                          Prior Function            PT Goals (current goals can now be found in the care plan section) Acute Rehab PT Goals Patient Stated Goal: return home after rehab PT Goal Formulation: With patient/family Time For Goal Achievement: 05/13/23 Potential to Achieve Goals: Good Progress towards PT goals: Progressing toward goals    Frequency    Min 3X/week      PT Plan      Co-evaluation              AM-PAC PT "6 Clicks" Mobility   Outcome Measure  Help needed turning from your back to your side while in a flat bed without using bedrails?: A Lot Help needed moving from lying on your back to sitting on the side of a flat bed without using bedrails?: A Lot Help needed moving to and from a bed to a chair (including a wheelchair)?: A Lot Help needed standing up from a chair using your arms (e.g., wheelchair or bedside chair)?: A Lot Help needed to walk in hospital room?: A Lot Help needed climbing 3-5 steps with a railing? : Total 6 Click Score: 11    End of Session   Activity Tolerance: Patient tolerated treatment well;Patient limited by fatigue Patient left: in chair;with call bell/phone within reach;with family/visitor present Nurse Communication: Mobility status PT Visit Diagnosis: Unsteadiness on feet (R26.81);Difficulty in walking, not elsewhere classified (R26.2);Other abnormalities of gait and mobility  (R26.89);Muscle weakness (generalized) (M62.81);History of falling (Z91.81);Pain Pain - Right/Left: Left Pain - part of body: Hip     Time: 1610-9604 PT Time Calculation (min) (ACUTE ONLY): 28 min  Charges:    $Therapeutic Exercise: 8-22 mins $Therapeutic Activity: 8-22 mins PT General Charges $$ ACUTE PT VISIT: 1 Visit                     1:31 PM, 05/08/23 Ocie Bob, MPT Physical Therapist with [facility] 336 [phone] office [phone] mobile phone

## 2023-05-08 NOTE — Plan of Care (Signed)
  Problem: Education: Goal: Knowledge of General Education information will improve Description: Including pain rating scale, medication(s)/side effects and non-pharmacologic comfort measures Outcome: Progressing   Problem: Health Behavior/Discharge Planning: Goal: Ability to manage health-related needs will improve Outcome: Progressing   Problem: Clinical Measurements: Goal: Ability to maintain clinical measurements within normal limits will improve Outcome: Progressing   Problem: Activity: Goal: Risk for activity intolerance will decrease Outcome: Progressing   Problem: Nutrition: Goal: Adequate nutrition will be maintained Outcome: Progressing   Problem: Pain Managment: Goal: General experience of comfort will improve and/or be controlled Outcome: Progressing   Problem: Safety: Goal: Ability to remain free from injury will improve Outcome: Progressing   Problem: Skin Integrity: Goal: Risk for impaired skin integrity will decrease Outcome: Progressing

## 2023-05-08 NOTE — Plan of Care (Signed)

## 2023-05-09 DIAGNOSIS — E785 Hyperlipidemia, unspecified: Secondary | ICD-10-CM | POA: Diagnosis not present

## 2023-05-09 DIAGNOSIS — R531 Weakness: Secondary | ICD-10-CM | POA: Diagnosis not present

## 2023-05-09 DIAGNOSIS — Z9181 History of falling: Secondary | ICD-10-CM | POA: Diagnosis not present

## 2023-05-09 DIAGNOSIS — G4709 Other insomnia: Secondary | ICD-10-CM | POA: Diagnosis not present

## 2023-05-09 DIAGNOSIS — R2689 Other abnormalities of gait and mobility: Secondary | ICD-10-CM | POA: Diagnosis not present

## 2023-05-09 DIAGNOSIS — Z7401 Bed confinement status: Secondary | ICD-10-CM | POA: Diagnosis not present

## 2023-05-09 DIAGNOSIS — K5909 Other constipation: Secondary | ICD-10-CM | POA: Diagnosis not present

## 2023-05-09 DIAGNOSIS — K219 Gastro-esophageal reflux disease without esophagitis: Secondary | ICD-10-CM | POA: Diagnosis not present

## 2023-05-09 DIAGNOSIS — M62562 Muscle wasting and atrophy, not elsewhere classified, left lower leg: Secondary | ICD-10-CM | POA: Diagnosis not present

## 2023-05-09 DIAGNOSIS — J45909 Unspecified asthma, uncomplicated: Secondary | ICD-10-CM | POA: Diagnosis not present

## 2023-05-09 DIAGNOSIS — Z743 Need for continuous supervision: Secondary | ICD-10-CM | POA: Diagnosis not present

## 2023-05-09 DIAGNOSIS — R41841 Cognitive communication deficit: Secondary | ICD-10-CM | POA: Diagnosis not present

## 2023-05-09 DIAGNOSIS — J449 Chronic obstructive pulmonary disease, unspecified: Secondary | ICD-10-CM | POA: Diagnosis not present

## 2023-05-09 DIAGNOSIS — I10 Essential (primary) hypertension: Secondary | ICD-10-CM | POA: Diagnosis not present

## 2023-05-09 DIAGNOSIS — M25552 Pain in left hip: Secondary | ICD-10-CM | POA: Diagnosis not present

## 2023-05-09 DIAGNOSIS — E569 Vitamin deficiency, unspecified: Secondary | ICD-10-CM | POA: Diagnosis not present

## 2023-05-09 DIAGNOSIS — S72002A Fracture of unspecified part of neck of left femur, initial encounter for closed fracture: Secondary | ICD-10-CM | POA: Diagnosis not present

## 2023-05-09 DIAGNOSIS — I959 Hypotension, unspecified: Secondary | ICD-10-CM | POA: Diagnosis not present

## 2023-05-09 DIAGNOSIS — E039 Hypothyroidism, unspecified: Secondary | ICD-10-CM | POA: Diagnosis not present

## 2023-05-09 DIAGNOSIS — S72002D Fracture of unspecified part of neck of left femur, subsequent encounter for closed fracture with routine healing: Secondary | ICD-10-CM | POA: Diagnosis not present

## 2023-05-09 DIAGNOSIS — D62 Acute posthemorrhagic anemia: Secondary | ICD-10-CM | POA: Diagnosis not present

## 2023-05-09 DIAGNOSIS — J4520 Mild intermittent asthma, uncomplicated: Secondary | ICD-10-CM | POA: Diagnosis not present

## 2023-05-09 MED ORDER — PANTOPRAZOLE SODIUM 40 MG PO TBEC: 40.0000 mg | DELAYED_RELEASE_TABLET | Freq: Every day | ORAL | 1 refills | Status: DC

## 2023-05-09 MED ORDER — LEVOTHYROXINE SODIUM 137 MCG PO TABS: 137.0000 ug | ORAL_TABLET | Freq: Every day | ORAL | 5 refills | Status: AC

## 2023-05-09 MED ORDER — ROSUVASTATIN CALCIUM 10 MG PO TABS: 10.0000 mg | ORAL_TABLET | Freq: Every evening | ORAL | 3 refills | Status: AC

## 2023-05-09 MED ORDER — OXYCODONE HCL 5 MG PO TABS: 5.0000 mg | ORAL_TABLET | ORAL | 0 refills | Status: DC | PRN

## 2023-05-09 MED ORDER — ARNUITY ELLIPTA 100 MCG/ACT IN AEPB: 1.0000 | INHALATION_SPRAY | Freq: Every day | RESPIRATORY_TRACT | 2 refills | Status: DC

## 2023-05-09 MED ORDER — METHOCARBAMOL 500 MG PO TABS: 500.0000 mg | ORAL_TABLET | Freq: Three times a day (TID) | ORAL | 0 refills | Status: AC

## 2023-05-09 MED ORDER — ACETAMINOPHEN 325 MG PO TABS: 650.0000 mg | ORAL_TABLET | Freq: Four times a day (QID) | ORAL | Status: DC | PRN

## 2023-05-09 MED ORDER — SENNOSIDES-DOCUSATE SODIUM 8.6-50 MG PO TABS: 2.0000 | ORAL_TABLET | Freq: Every day | ORAL | 2 refills | Status: DC

## 2023-05-09 MED ORDER — ALBUTEROL SULFATE HFA 108 (90 BASE) MCG/ACT IN AERS: 2.0000 | INHALATION_SPRAY | Freq: Four times a day (QID) | RESPIRATORY_TRACT | 2 refills | Status: AC | PRN

## 2023-05-09 MED ORDER — POLYETHYLENE GLYCOL 3350 17 G PO PACK: 17.0000 g | PACK | Freq: Two times a day (BID) | ORAL | 0 refills | Status: DC

## 2023-05-09 MED ORDER — TRAZODONE HCL 50 MG PO TABS: 50.0000 mg | ORAL_TABLET | Freq: Every evening | ORAL | 0 refills | Status: AC | PRN

## 2023-05-09 MED ORDER — ASPIRIN 81 MG PO TBEC
81.0000 mg | DELAYED_RELEASE_TABLET | Freq: Two times a day (BID) | ORAL | 0 refills | Status: AC
Start: 2023-05-09 — End: 2023-06-23

## 2023-05-09 MED ORDER — METHOCARBAMOL 500 MG PO TABS
750.0000 mg | ORAL_TABLET | Freq: Once | ORAL | Status: AC
  Administered 2023-05-09: 750 mg via ORAL
  Filled 2023-05-09: qty 2

## 2023-05-09 NOTE — TOC Transition Note (Signed)
 Transition of Care [facility]) - Discharge Note   Patient Details  Name: Melanie Butler MRN: [id number] Date of Birth: 04-30-1945  Transition of Care [facility]) CM/SW Contact:  Leitha Bleak, RN Phone Number: 05/09/2023, 11:54 AM   Clinical Narrative:   Patient discharging to [facility], Spouse updated, Destiny provided room number, RN calling report, EMS scheduled and clinicals sent in the hub.    Final next level of care: Skilled Nursing Facility Barriers to Discharge: Barriers Resolved   Patient Goals and CMS Choice Patient states their goals for this hospitalization and ongoing recovery are:: SNF CMS Medicare.gov Compare Post Acute Care list provided to:: Patient Choice offered to / list presented to : [name] ownership interest in [facility].provided to:: Patient    Discharge Placement                Patient to be transferred to facility by: EMS Name of family member notified: Spouse Patient and family notified of of transfer: 05/09/23  Discharge Plan and Services Additional resources added to the After Visit Summary for   In-house Referral: Clinical Social Work   Post Acute Care Choice: Skilled Nursing Facility                               Social Drivers of Health (SDOH) Interventions SDOH Screenings   Food Insecurity: No Food Insecurity (05/04/2023)  Housing: Low Risk  (05/05/2023)  Transportation Needs: No Transportation Needs (05/04/2023)  Utilities: Not At Risk (05/04/2023)  Social Connections: Socially Integrated (05/04/2023)  Tobacco Use: Low Risk  (05/05/2023)     Readmission Risk Interventions     No data to display

## 2023-05-09 NOTE — Plan of Care (Signed)
  Problem: Education: Goal: Knowledge of General Education information will improve Description: Including pain rating scale, medication(s)/side effects and non-pharmacologic comfort measures Outcome: Progressing   Problem: Activity: Goal: Risk for activity intolerance will decrease Outcome: Progressing   Problem: Nutrition: Goal: Adequate nutrition will be maintained Outcome: Progressing   Problem: Elimination: Goal: Will not experience complications related to bowel motility Outcome: Progressing   Problem: Pain Managment: Goal: General experience of comfort will improve and/or be controlled Outcome: Progressing   Problem: Safety: Goal: Ability to remain free from injury will improve Outcome: Progressing   Problem: Skin Integrity: Goal: Risk for impaired skin integrity will decrease Outcome: Progressing

## 2023-05-09 NOTE — Progress Notes (Signed)
 Patient discharged to [facility] [place],report called and given to [place].[facility] [place] to transport patient to awaiting facility.IV discontinued,catheter intact.Family at bedside.

## 2023-05-09 NOTE — Discharge Instructions (Signed)
 1)Avoid ibuprofen/Advil/Aleve/Motrin/Goody Powders/Naproxen/BC powders/Meloxicam/Diclofenac/Indomethacin and other Nonsteroidal anti-inflammatory medications as these will make you more likely to bleed and can cause stomach ulcers, can also cause Kidney problems.   2)Take enteric-coated Aspirin 81 mg with meals twice daily For 6 weeks for postsurgical DVT prophylaxis  3)Repeat CBC and BMP Blood test in 1 week  4)Post-operative plan:  The patient will be WBAT on the operative extremity. Hip abduction pillow in place at all times when in bed Posterior hip precautions Orthopedic Follow up plan will be scheduled in approximately 14 days for incision check and repeat XRay.

## 2023-05-09 NOTE — Discharge Summary (Signed)
 Melanie Butler, is a 78 y.o. female  DOB 1946/02/12  MRN [id number].  Admission date:  05/04/2023  Admitting Physician  Briscoe Deutscher, MD  Discharge Date:  05/09/2023   Primary MD  Laurann Montana, MD  Recommendations for primary care physician for things to follow:  1)Avoid ibuprofen/Advil/Aleve/Motrin/Goody Powders/Naproxen/BC powders/Meloxicam/Diclofenac/Indomethacin and other Nonsteroidal anti-inflammatory medications as these will make you more likely to bleed and can cause stomach ulcers, can also cause Kidney problems.   2)Take enteric-coated Aspirin 81 mg with meals twice daily For 6 weeks for postsurgical DVT prophylaxis  3)Repeat CBC and BMP Blood test in 1 week  4)Post-operative plan:  The patient will be WBAT on the operative extremity. Hip abduction pillow in place at all times when in bed Posterior hip precautions Orthopedic Follow up plan will be scheduled in approximately 14 days for incision check and repeat XRay.  Admission Diagnosis  Closed fracture of left hip, initial encounter (HCC) [S72.002A] Closed left hip fracture, initial encounter (HCC) [S72.002A]  Discharge Diagnosis  Closed fracture of left hip, initial encounter (HCC) [S72.002A] Closed left hip fracture, initial encounter (HCC) [S72.002A]    Principal Problem:   Closed left hip fracture, initial encounter (HCC) Active Problems:   Asthma, chronic   COPD/Emphysema   HLD (hyperlipidemia)   Hypothyroidism      Past Medical History:  Diagnosis Date   Hyperlipidemia    Thyroid disease     Past Surgical History:  Procedure Laterality Date   TOTAL HIP ARTHROPLASTY Left 05/05/2023   Procedure: ARTHROPLASTY, HIP, TOTAL,POSTERIOR APPROACH;  Surgeon: Oliver Barre, MD;  Location: [facility];  Service: Orthopedics;  Laterality: Left;     HPI  from the history and physical done on the day of admission:    HPI: Melanie Butler is a  78 y.o. female with medical history significant for chronic asthma, HLD and hypothyroidism who presents from home with left hip pain after mechanical fall from tripping over a rug -Additional history obtained from patient's son Kathlene November at bedside -No chest pains no palpitations no head injury no loss of consciousness or dizziness before or after fall -No fever  Or chills    No Nausea, Vomiting or Diarrhea -- In the ED hip x-rays with Impacted and otherwise nondisplaced subcapital transverse fracture of the proximal left femur. -Chest x-ray with emphysema -CBC with a white count of 17, hemoglobin 13.7 and platelets 248 -With a creatinine of 0.95, LFTs are not elevated, +3.5 glucose 123   Review of Systems: As mentioned in the history of present illness. All other systems reviewed and are negative.   Hospital Course:   Assessment and Plan: Brief Narrative:  Melanie Butler is a 78 y.o. female with medical history significant for chronic asthma, HLD and hypothyroidism who presents from home with left hip pain after mechanical fall from tripping over a rug .  She was admitted on 05/04/2023 with left hip fracture, underwent ORIF on 05/05/2023   -Assessment and Plan: 1)LT Hip Fx ---Impacted and otherwise nondisplaced subcapital  transverse fracture of the proximal left femur. -s/p ORIF on 05/05/23 -Aspirin 81 mg with meals twice daily For 6 weeks for postsurgical DVT prophylaxis -Post-operative plan:  Post-operative plan:  The patient will be WBAT on the operative extremity. Hip abduction pillow in place at all times when in bed Posterior hip precautions Orthopedic Follow up plan will be scheduled in approximately 14 days for incision check and repeat XRay.   2)Asthma/Emphysema--- no acute exacerbation at this time  -c/n bronchodilators   3)HLD- resume statin    4)Hypothyroidism--continue levothyroxine   5) acute anemia--suspect due to perioperative blood loss in setting of Lt Femoral fracture  operative fixation of left femoral fracture -- Hgb currently above 10 -Repeat CBC in about a week   Disposition: The patient is from: Home              Anticipated d/c is to: SNF  Discharge Condition: stable  Follow UP   Contact information for follow-up providers     Oliver Barre, MD. Schedule an appointment as soon as possible for a visit in 2 week(s).   Specialties: Orthopedic Surgery, Sports Medicine Why: Hip replacement--- postop follow-up Contact information: [address] [place] [address] [phone]              Contact information for after-discharge care     Destination     [facility] Preferred SNF .   Service: Skilled Nursing Contact information: [address] [place] [address] [phone]                     Consults obtained -orthopedics  Diet and Activity recommendation:  As advised  Discharge Instructions    Discharge Instructions     Call MD for:  difficulty breathing, headache or visual disturbances   Complete by: As directed    Call MD for:  persistant dizziness or light-headedness   Complete by: As directed    Call MD for:  persistant nausea and vomiting   Complete by: As directed    Call MD for:  redness, tenderness, or signs of infection (pain, swelling, redness, odor or green/yellow discharge around incision site)   Complete by: As directed    Call MD for:  temperature >100.4   Complete by: As directed    Diet - low sodium heart healthy   Complete by: As directed    Discharge instructions   Complete by: As directed    1)Avoid ibuprofen/Advil/Aleve/Motrin/Goody Powders/Naproxen/BC powders/Meloxicam/Diclofenac/Indomethacin and other Nonsteroidal anti-inflammatory medications as these will make you more likely to bleed and can cause stomach ulcers, can also cause Kidney problems.   2)Take enteric-coated Aspirin 81 mg with meals twice daily For 6 weeks for postsurgical DVT  prophylaxis  3)Repeat CBC and BMP Blood test in 1 week  4)Post-operative plan:  The patient will be WBAT on the operative extremity. Hip abduction pillow in place at all times when in bed Posterior hip precautions Orthopedic Follow up plan will be scheduled in approximately 14 days for incision check and repeat XRay.   Increase activity slowly   Complete by: As directed          Discharge Medications     Allergies as of 05/09/2023       Reactions   Ceclor [cefaclor] Itching, Swelling   Tetanus Toxoids Rash   "red streaks down arm from injection site" many years ago        Medication List  TAKE these medications    acetaminophen 325 MG tablet Commonly known as: TYLENOL Take 2 tablets (650 mg total) by mouth every 6 (six) hours as needed for mild pain (pain score 1-3), fever or headache (or Fever >/= 101).   albuterol 108 (90 Base) MCG/ACT inhaler Commonly known as: VENTOLIN HFA Inhale 2 puffs into the lungs every 6 (six) hours as needed for wheezing or shortness of breath. What changed: how much to take   Arnuity Ellipta 100 MCG/ACT Aepb Generic drug: Fluticasone Furoate Inhale 1 puff into the lungs daily.   aspirin EC 81 MG tablet Take 1 tablet (81 mg total) by mouth 2 (two) times daily with a meal. For 6 weeks for postsurgical DVT prophylaxis   HM Vitamin D3 100 MCG (4000 UT) Caps Generic drug: Cholecalciferol Take 4,000 Units by mouth daily.   levothyroxine 137 MCG tablet Commonly known as: SYNTHROID Take 1 tablet (137 mcg total) by mouth daily before breakfast. What changed: when to take this   methocarbamol 500 MG tablet Commonly known as: ROBAXIN Take 1 tablet (500 mg total) by mouth 3 (three) times daily.   omega-3 acid ethyl esters 1 g capsule Commonly known as: LOVAZA Take 1 g by mouth 2 (two) times daily.   oxyCODONE 5 MG immediate release tablet Commonly known as: Oxy IR/ROXICODONE Take 1 tablet (5 mg total) by mouth every 4 (four)  hours as needed for moderate pain (pain score 4-6).   pantoprazole 40 MG tablet Commonly known as: Protonix Take 1 tablet (40 mg total) by mouth daily.   polyethylene glycol 17 g packet Commonly known as: MIRALAX / GLYCOLAX Take 17 g by mouth 2 (two) times daily.   rosuvastatin 10 MG tablet Commonly known as: CRESTOR Take 1 tablet (10 mg total) by mouth at bedtime. art Crestor 10 mg po at bedtime. No grapefruit juice   senna-docusate 8.6-50 MG tablet Commonly known as: Senokot-S Take 2 tablets by mouth at bedtime.   traZODone 50 MG tablet Commonly known as: DESYREL Take 1 tablet (50 mg total) by mouth at bedtime as needed for sleep.        Major procedures and Radiology Reports - PLEASE review detailed and final reports for all details, in brief -   DG HIP UNILAT WITH PELVIS 2-3 VIEWS LEFT Result Date: 05/05/2023 CLINICAL DATA:  Hip fracture.  ORIF EXAM: DG HIP (WITH OR WITHOUT PELVIS) 3V LEFT COMPARISON:  Preop x-ray 05/04/2023 FINDINGS: Osteopenia. Interval placement of a left hip hemiarthroplasty with Press-Fit components. Expected alignment. Lateral soft tissue gas. No additional fracture or dislocation. Mild degenerative changes of the right hip and sacroiliac joints. Imaging was obtained to aid in treatment. Scattered presumed vascular calcifications along the pelvis. IMPRESSION: Interval acute surgical changes of left hip hemiarthroplasty. Electronically Signed   By: Karen Kays M.D.   On: 05/05/2023 12:04   DG CHEST PORT 1 VIEW Result Date: 05/04/2023 CLINICAL DATA:  [id number].  Left hip fracture. [id number].  Preoperative testing prior to surgery. EXAM: PORTABLE CHEST 1 VIEW COMPARISON:  Partial chest CT for coronary calcium scoring 02/18/2023, PA and lateral chest 09/20/2022. FINDINGS: The lungs are slightly emphysematous but clear aside from linear scarring or atelectasis along the left heart border. There is mild elevation of the right hemidiaphragm. No pleural effusion is seen.  The cardiomediastinal silhouette is normal. There is osteopenia and thoracic spondylosis. There are calcified left axillary lymph nodes. Artifacts from overlying clothing. IMPRESSION: 1. No evidence of acute chest disease. Emphysema. 2.  Osteopenia and thoracic spondylosis. Electronically Signed   By: Almira Bar M.D.   On: 05/04/2023 07:26   DG Hip Unilat W or Wo Pelvis 2-3 Views Left Result Date: 05/04/2023 CLINICAL DATA:  Fall with left hip pain. EXAM: DG HIP (WITH OR WITHOUT PELVIS) 2-3V LEFT COMPARISON:  None Available. FINDINGS: Three views including AP pelvis. There is an impacted and otherwise nondisplaced subcapital transverse acute fracture of the proximal left femur. There is no dislocation. No pelvic fracture or diastasis. The bilateral hip joint spaces are maintained. The SI joints and symphysis pubis are unremarkable, as visualized. The proximal right femur intact AP. There are calcified fibroids in the pelvis. Pelvic phleboliths. Artifacts from overlying clothing. IMPRESSION: Impacted and otherwise nondisplaced subcapital transverse fracture of the proximal left femur. Electronically Signed   By: Almira Bar M.D.   On: 05/04/2023 04:43   Today   Subjective    Melanie Butler today has no new complaints -Left hip pain improving No fever  Or chills          No Nausea, Vomiting or Diarrhea   Patient has been seen and examined prior to discharge   Objective   Blood pressure (!) 124/52, pulse 64, temperature (!) 97.3 F (36.3 C), temperature source Oral, resp. rate 19, height 5\' 3"  (1.6 m), weight 72 kg, SpO2 96%.   Intake/Output Summary (Last 24 hours) at 05/09/2023 1134 Last data filed at 05/09/2023 0800 Gross per 24 hour  Intake 1140 ml  Output 2350 ml  Net -1210 ml   Exam Gen:- Awake Alert, in no acute distress HEENT:- [place].AT, No sclera icterus Neck-Supple Neck,No JVD,.  Lungs-  CTAB , fair symmetrical air movement CV- S1, S2 normal, regular  Abd-  +ve B.Sounds, Abd  Soft, No tenderness,    Extremity/Skin:- No  edema, pedal pulses present  Psych-affect is appropriate, oriented x3 Neuro-generalized weakness, no new focal deficits, no tremors MSK-Left hip postop wound dry and intact    Data Review   CBC w Diff:  Lab Results  Component Value Date   WBC 8.3 05/08/2023   HGB 10.6 (L) 05/08/2023   HCT 32.6 (L) 05/08/2023   PLT 168 05/08/2023   LYMPHOPCT 10 05/04/2023   MONOPCT 4 05/04/2023   EOSPCT 0 05/04/2023   BASOPCT 0 05/04/2023   CMP:  Lab Results  Component Value Date   NA 136 05/04/2023   K 3.5 05/04/2023   CL 103 05/04/2023   CO2 22 05/04/2023   BUN 21 05/04/2023   CREATININE 0.95 05/04/2023   PROT 6.5 05/04/2023   ALBUMIN 3.9 05/04/2023   BILITOT 0.8 05/04/2023   ALKPHOS 83 05/04/2023   AST 26 05/04/2023   ALT 26 05/04/2023  . Total Discharge time is about 33 minutes  Shon Hale M.D on 05/09/2023 at 11:34 AM  Go to [email or web address] -  for contact info  [facility] - Office  [phone]

## 2023-05-09 NOTE — Care Management Important Message (Signed)
 Important Message  Patient Details  Name: Melanie Butler MRN: [id number] Date of Birth: 03-Jun-1945   Important Message Given:  Yes - Medicare IM     Corey Harold 05/09/2023, 10:45 AM

## 2023-05-09 NOTE — Progress Notes (Signed)
 Nurse at bedside,patient alert and oriented times four. Patient is hard of hearing. Did c/o left hip discomfort this am rated a 3,dull and achy.Plan of care on going.

## 2023-05-20 ENCOUNTER — Encounter: Payer: Self-pay | Admitting: Orthopedic Surgery

## 2023-05-20 ENCOUNTER — Other Ambulatory Visit (INDEPENDENT_AMBULATORY_CARE_PROVIDER_SITE_OTHER): Payer: Self-pay

## 2023-05-20 ENCOUNTER — Ambulatory Visit: Admitting: Orthopedic Surgery

## 2023-05-20 DIAGNOSIS — S72002A Fracture of unspecified part of neck of left femur, initial encounter for closed fracture: Secondary | ICD-10-CM

## 2023-05-20 NOTE — Progress Notes (Signed)
 Orthopaedic Postop Note  Assessment: Melanie Butler is a 78 y.o. female s/p Left hip hemiarthroplasty  DOS: 05/05/2023  Plan: Sutures trimmed, steri strips placed Continue using hip abduction pillow while in bed until 6 weeks after surgery Continue posterior hip precautions until 3 months postop Continue with DVT prophylaxis for at least 6 weeks after surgery WBAT on the operative extremity Follow up in 4 weeks; call with any issues  No orders of the defined types were placed in this encounter.    Follow-up: No follow-ups on file. XR at next visit: AP pelvis and Left hip  Subjective:  Chief Complaint  Patient presents with   Routine Post Op    L hip DOS 05/05/23    History of Present Illness: Melanie Butler is a 78 y.o. female who presents following the above stated procedure.  She is doing well.  She is currently in a rehab facility in [place].  She is being discharged tomorrow.  She has not had any narcotics in the past couple of days.  She is working well with therapy.  No numbness or tingling.  She does have some swelling in her left foot, and she is asking about this.  Review of Systems: No fevers or chills No numbness or tingling No Chest Pain No shortness of breath   Objective: There were no vitals taken for this visit.  Physical Exam:  Alert and oriented.  No acute distress.  Seated in a wheelchair  Surgical incision is healing.  No surrounding erythema.  Small amount of serous drainage.  She is able to maintain a straight leg raise.  She does have some diffuse swelling in the lower leg.  She tolerates gentle range of motion of the left hip.  Toes warm and well-perfused.  Active motion intact in the EHL/TA.  IMAGING: I personally ordered and reviewed the following images:  XR of the Left hip and AP pelvis demonstrates a hip hemiarthroplasty in good position.  The hip remains reduced.  There is no evidence of implant subsidence.  No acute fractures are  noted.  Impression: Left hip hemiarthroplasty in stable position, without evidence of migration or subsidence compared to prior XR   Oliver Barre, MD 05/20/2023 10:00 AM

## 2023-05-20 NOTE — Patient Instructions (Signed)
 Sutures trimmed, steri strips placed Continue using hip abduction pillow while in bed until 6 weeks after surgery Continue posterior hip precautions until 3 months postop Continue with DVT prophylaxis for at least 6 weeks after surgery WBAT on the operative extremity Follow up in 4 weeks; call with any issues

## 2023-05-21 DIAGNOSIS — I10 Essential (primary) hypertension: Secondary | ICD-10-CM | POA: Diagnosis not present

## 2023-05-21 DIAGNOSIS — M25552 Pain in left hip: Secondary | ICD-10-CM | POA: Diagnosis not present

## 2023-05-21 DIAGNOSIS — J4520 Mild intermittent asthma, uncomplicated: Secondary | ICD-10-CM | POA: Diagnosis not present

## 2023-05-26 DIAGNOSIS — D649 Anemia, unspecified: Secondary | ICD-10-CM | POA: Diagnosis not present

## 2023-05-26 DIAGNOSIS — Z9181 History of falling: Secondary | ICD-10-CM | POA: Diagnosis not present

## 2023-05-26 DIAGNOSIS — Z8781 Personal history of (healed) traumatic fracture: Secondary | ICD-10-CM | POA: Diagnosis not present

## 2023-05-26 DIAGNOSIS — M810 Age-related osteoporosis without current pathological fracture: Secondary | ICD-10-CM | POA: Diagnosis not present

## 2023-05-26 DIAGNOSIS — M7989 Other specified soft tissue disorders: Secondary | ICD-10-CM | POA: Diagnosis not present

## 2023-05-27 ENCOUNTER — Ambulatory Visit (HOSPITAL_COMMUNITY)
Admission: RE | Admit: 2023-05-27 | Discharge: 2023-05-27 | Disposition: A | Discharge status: Home / Self Care | Source: Ambulatory Visit | Attending: Family Medicine | Admitting: Family Medicine

## 2023-05-27 ENCOUNTER — Other Ambulatory Visit (HOSPITAL_COMMUNITY): Payer: Self-pay | Admitting: Family Medicine

## 2023-05-27 DIAGNOSIS — R7989 Other specified abnormal findings of blood chemistry: Secondary | ICD-10-CM | POA: Insufficient documentation

## 2023-05-27 DIAGNOSIS — Z96642 Presence of left artificial hip joint: Secondary | ICD-10-CM | POA: Diagnosis not present

## 2023-05-27 DIAGNOSIS — Z471 Aftercare following joint replacement surgery: Secondary | ICD-10-CM | POA: Diagnosis not present

## 2023-05-27 DIAGNOSIS — I82412 Acute embolism and thrombosis of left femoral vein: Secondary | ICD-10-CM | POA: Diagnosis not present

## 2023-05-27 DIAGNOSIS — M79662 Pain in left lower leg: Secondary | ICD-10-CM | POA: Insufficient documentation

## 2023-05-27 DIAGNOSIS — M7989 Other specified soft tissue disorders: Secondary | ICD-10-CM | POA: Diagnosis not present

## 2023-05-27 DIAGNOSIS — R600 Localized edema: Secondary | ICD-10-CM | POA: Diagnosis not present

## 2023-06-21 ENCOUNTER — Encounter: Payer: Self-pay | Admitting: Orthopedic Surgery

## 2023-06-21 ENCOUNTER — Other Ambulatory Visit (INDEPENDENT_AMBULATORY_CARE_PROVIDER_SITE_OTHER): Payer: Self-pay

## 2023-06-21 ENCOUNTER — Ambulatory Visit (INDEPENDENT_AMBULATORY_CARE_PROVIDER_SITE_OTHER): Admitting: Orthopedic Surgery

## 2023-06-21 DIAGNOSIS — S72002A Fracture of unspecified part of neck of left femur, initial encounter for closed fracture: Secondary | ICD-10-CM

## 2023-06-21 NOTE — Progress Notes (Signed)
 Orthopaedic Postop Note  Assessment: Febe Champa Butler is a 78 y.o. female s/p Left hip hemiarthroplasty  DOS: 05/05/2023  Plan: [name] is doing quite well.  Radiographs remained stable.  She is ambulating well with a cane only.  No severe pain at this time.  Occasional Tylenol .  She is currently taking Eliquis, secondary to a DVT in the mid to distal left femoral vein.  Otherwise no issues.  Continue with posterior hip precautions.  Continue to work on strengthening, which will allow her to transition off the cane.  She states understanding.  Follow-up in 6 weeks.   Follow-up: Return in about 6 weeks (around 08/02/2023). XR at next visit: AP pelvis and Left hip  Subjective:  Chief Complaint  Patient presents with   Routine Post Op    L Hemiarthroplasty DOS: 05/05/2023    History of Present Illness: Melanie Butler is a 78 y.o. female who presents following the above stated procedure.  She has been discharged from a nursing facility.  Unfortunately, she was diagnosed with a DVT approximately 1 week after I saw her in clinic.  She has been taking Eliquis.  She is having pain in the left lateral hip.  She has stiffness.  Otherwise, she is having no complaints at this time.  She noted an area of increased sensitivity over the anterior aspect of the lower leg.  This is improved.  She has some mild swelling.   Review of Systems: No fevers or chills No numbness or tingling No Chest Pain No shortness of breath   Objective: There were no vitals taken for this visit.  Physical Exam:  Alert and oriented.  No acute distress.  Ambulating well with the assistance of a cane.  Mild tenderness over the lateral hip.  She is able to make an a straight leg raise.  She tolerates gentle range of motion of the left hip.  Sensation is intact distally.  Mild swelling in the lower leg.  IMAGING: I personally ordered and reviewed the following images:  AP pelvis and left hip x-rays were obtained in  clinic today.  These are compared to available x-rays.  Left hip hemiarthroplasty in stable alignment.  No evidence of fracture.  No evidence of subsidence.  The hip joint is reduced.  No bony lesions.  Impression: Stable left hip hemiarthroplasty, without subsidence.  Tonita Frater, MD 06/21/2023 11:30 AM

## 2023-06-28 DIAGNOSIS — D649 Anemia, unspecified: Secondary | ICD-10-CM | POA: Diagnosis not present

## 2023-07-04 NOTE — Progress Notes (Unsigned)
 Melanie Butler, female    DOB: 01-07-1946    MRN: [id number]   Brief patient profile:  82  yowf never smoker with bad asthma as child outgrew but continue allergies spring >  fall (never saw allergist) worse since 2021  referred to pulmonary clinic in [place]  07/05/2023 by Dr Camilo Cella  for sob lasts for  breath or two since 2023.  Not prev seen by PCCM  History of Present Illness  07/05/2023  Pulmonary/ 1st office eval/ Melanie Butler / [place] [facility]  Chief Complaint  Patient presents with   Shortness of Breath  Dyspnea:  avg spells once a week  Cough: very raspy voice but no real cough  Sleep: bed is flat / big fluffy pillow  SABA use: has alb not really quick enough  02: none    No obvious day to day or daytime pattern/variability or assoc excess/ purulent sputum or mucus plugs or hemoptysis or cp or chest tightness, subjective wheeze or overt  hb symptoms.    Also denies any obvious fluctuation of symptoms with weather or environmental changes or other aggravating or alleviating factors except as outlined above   No unusual exposure hx or h/o childhood pna  or knowledge of premature birth.  Current Allergies, Complete Past Medical History, Past Surgical History, Family History, and Social History were reviewed in [employer] record.  ROS  The following are not active complaints unless bolded Hoarseness, sore throat, dysphagia, dental problems, itching, sneezing,  nasal congestion or discharge of excess mucus or purulent secretions, ear ache,   fever, chills, sweats, unintended wt loss or wt gain, classically pleuritic or exertional cp,  orthopnea pnd or arm/hand swelling  or leg swelling, presyncope, palpitations, abdominal pain, anorexia, nausea, vomiting, diarrhea  or change in bowel habits or change in bladder habits, change in stools or change in urine, dysuria, hematuria,  rash, arthralgias, visual complaints, headache, numbness, weakness or ataxia or problems with  walking or coordination,  change in mood or  memory.            Outpatient Medications Prior to Visit  Medication Sig Dispense Refill   ELIQUIS 5 MG TABS tablet Take 5 mg by mouth 2 (two) times daily.     levothyroxine  (SYNTHROID ) 137 MCG tablet Take 1 tablet (137 mcg total) by mouth daily before breakfast. 30 tablet 5   rosuvastatin  (CRESTOR ) 10 MG tablet Take 1 tablet (10 mg total) by mouth at bedtime. art Crestor  10 mg po at bedtime. No grapefruit juice 30 tablet 3   traZODone  (DESYREL ) 50 MG tablet Take 1 tablet (50 mg total) by mouth at bedtime as needed for sleep. 30 tablet 0   acetaminophen  (TYLENOL ) 325 MG tablet Take 2 tablets (650 mg total) by mouth every 6 (six) hours as needed for mild pain (pain score 1-3), fever or headache (or Fever >/= 101). (Patient not taking: Reported on 07/05/2023)     albuterol  (VENTOLIN  HFA) 108 (90 Base) MCG/ACT inhaler Inhale 2 puffs into the lungs every 6 (six) hours as needed for wheezing or shortness of breath. (Patient not taking: Reported on 07/05/2023) 18 g 2   ARNUITY ELLIPTA  100 MCG/ACT AEPB Inhale 1 puff into the lungs daily. (Patient not taking: Reported on 07/05/2023) 30 each 2   Cholecalciferol  (HM VITAMIN D3) 4000 units CAPS Take 4,000 Units by mouth daily. (Patient not taking: Reported on 07/05/2023)     omega-3 acid ethyl esters (LOVAZA) 1 g capsule Take 1 g by  mouth 2 (two) times daily. (Patient not taking: Reported on 07/05/2023)     oxyCODONE  (OXY IR/ROXICODONE ) 5 MG immediate release tablet Take 1 tablet (5 mg total) by mouth every 4 (four) hours as needed for moderate pain (pain score 4-6). (Patient not taking: Reported on 07/05/2023) 15 tablet 0   pantoprazole  (PROTONIX ) 40 MG tablet Take 1 tablet (40 mg total) by mouth daily. (Patient not taking: Reported on 07/05/2023) 30 tablet 1   polyethylene glycol (MIRALAX  / GLYCOLAX ) 17 g packet Take 17 g by mouth 2 (two) times daily. (Patient not taking: Reported on 07/05/2023) 60 each 0   senna-docusate  (SENOKOT-S) 8.6-50 MG tablet Take 2 tablets by mouth at bedtime. (Patient not taking: Reported on 07/05/2023) 60 tablet 2   No facility-administered medications prior to visit.    Past Medical History:  Diagnosis Date   Hyperlipidemia    Thyroid  disease       Objective:     BP 131/67 (BP Location: Left Arm)   Pulse 75   Ht 5\' 3"  (1.6 m)   Wt 155 lb 6.4 oz (70.5 kg)   SpO2 95% Comment: RA  BMI 27.53 kg/m   SpO2: 95 % (RA) amb wm classic voice fatigue / walks with cane   HEENT : Oropharynx  clear      Nasal turbinates nl    NECK :  without  apparent JVD/ palpable Nodes/TM / prominent pseudowheeze with FVC/ resolves with purse lip breathing    LUNGS: no acc muscle use,  Nl contour chest which is clear to A and P bilaterally without cough on insp or exp maneuvers   CV:  RRR  no s3 or murmur or increase in P2, and no edema   ABD:  soft and nontender   MS:  Gait slow/walks   ext warm without deformities Or obvious joint restrictions  calf tenderness, cyanosis or clubbing    SKIN: warm and dry without lesions    NEURO:  alert, approp, nl sensorium with  no motor or cerebellar deficits apparent.       I personally reviewed images and agree with radiology impression as follows:  CXR:   05/04/23 1. No evidence of acute chest disease. Emphysema. 2. Osteopenia and thoracic spondylosis.  Assessment   DOE (dyspnea on exertion) Onset 20023 in setting of Allergic rhinitis - rx arnuity > d/c 07/05/2023 as no better and voice fatigue present - Allergy screen 07/05/2023 >  Eos 0. /  IgE   - max gerd rx 07/05/2023 >>>   Symptoms are markedly disproportionate to objective findings and not clear to what extent this is actually a pulmonary  problem but pt does appear to have difficult to sort out respiratory symptoms of unknown origin for which  DDX  = almost all start with A and  include Adherence, Ace Inhibitors, Acid Reflux, Active Sinus Disease, Alpha 1 Antitripsin deficiency, Anxiety  masquerading as Airways dz,  ABPA,  Allergy(esp in young), Aspiration (esp in elderly), Adverse effects of meds,  Active smoking or Vaping, A bunch of PE's/clot burden (a few small clots can't cause this syndrome unless there is already severe underlying pulm or vascular dz with poor reserve),  Anemia or thyroid  disorder, plus two Bs  = Bronchiectasis and Beta blocker use..and one C= CHF    Adherence is always the initial "prime suspect" and is a multilayered concern that requires a "trust but verify" approach in every patient - starting with knowing how to use medications, especially  inhalers, correctly, keeping up with refills and understanding the fundamental difference between maintenance and prns vs those medications only taken for a very short course and then stopped and not refilled.  - - The proper method of use, as well as anticipated side effects, of a metered-dose inhaler were discussed and demonstrated to the patient using teach back method   ? Acid (or non-acid) GERD > always difficult to exclude as up to 75% of pts in some series report no assoc GI/ Heartburn symptoms> rec max (24h)  acid suppression and diet restrictions/ reviewed and instructions given in writing.   ? Allergy/asthma  > check labs and try off arnuity unless flares and if so resume it.  - just use saba prn for now   ? Alpha one AT > never smoker with ? Emphysema on cxr > check phenotype    F/u 6 weeks with all meds in hand using a trust but verify approach to confirm accurate Medication  Reconciliation The principal here is that until we are certain that the  patients are doing what we've asked, it makes no sense to ask them to do more.          Each maintenance medication was reviewed in detail including emphasizing most importantly the difference between maintenance and prns and under what circumstances the prns are to be triggered using an action plan format where appropriate.  Total time for H and P, chart  review, counseling, reviewing hfa device(s) and generating customized AVS unique to this office visit / same day charting = 50 min new pt eval for chornic   refractory respiratory  symptoms of uncertain etiology            Vernestine Gondola, MD 07/05/2023

## 2023-07-05 ENCOUNTER — Encounter: Payer: Self-pay | Admitting: Internal Medicine

## 2023-07-05 ENCOUNTER — Ambulatory Visit: Admitting: Internal Medicine

## 2023-07-05 VITALS — BP 131/67 | HR 75 | Ht 63.0 in | Wt 155.4 lb

## 2023-07-05 DIAGNOSIS — R0609 Other forms of dyspnea: Secondary | ICD-10-CM | POA: Insufficient documentation

## 2023-07-05 MED ORDER — PANTOPRAZOLE SODIUM 40 MG PO TBEC: 40.0000 mg | DELAYED_RELEASE_TABLET | Freq: Every day | ORAL | 2 refills | Status: DC

## 2023-07-05 MED ORDER — FAMOTIDINE 20 MG PO TABS: ORAL_TABLET | ORAL | 11 refills | Status: AC

## 2023-07-05 NOTE — Assessment & Plan Note (Addendum)
 Onset 20023 in setting of Allergic rhinitis - rx arnuity > d/c 07/05/2023 as no better and voice fatigue present - Allergy screen 07/05/2023 >  Eos 0. /  IgE   - max gerd rx 07/05/2023 >>>   Symptoms are markedly disproportionate to objective findings and not clear to what extent this is actually a pulmonary  problem but pt does appear to have difficult to sort out respiratory symptoms of unknown origin for which  DDX  = almost all start with A and  include Adherence, Ace Inhibitors, Acid Reflux, Active Sinus Disease, Alpha 1 Antitripsin deficiency, Anxiety masquerading as Airways dz,  ABPA,  Allergy(esp in young), Aspiration (esp in elderly), Adverse effects of meds,  Active smoking or Vaping, A bunch of PE's/clot burden (a few small clots can't cause this syndrome unless there is already severe underlying pulm or vascular dz with poor reserve),  Anemia or thyroid  disorder, plus two Bs  = Bronchiectasis and Beta blocker use..and one C= CHF    Adherence is always the initial "prime suspect" and is a multilayered concern that requires a "trust but verify" approach in every patient - starting with knowing how to use medications, especially inhalers, correctly, keeping up with refills and understanding the fundamental difference between maintenance and prns vs those medications only taken for a very short course and then stopped and not refilled.  - - The proper method of use, as well as anticipated side effects, of a metered-dose inhaler were discussed and demonstrated to the patient using teach back method   ? Acid (or non-acid) GERD > always difficult to exclude as up to 75% of pts in some series report no assoc GI/ Heartburn symptoms> rec max (24h)  acid suppression and diet restrictions/ reviewed and instructions given in writing.   ? Allergy/asthma  > check labs and try off arnuity unless flares and if so resume it.  - just use saba prn for now   ? Alpha one AT > never smoker with ? Emphysema on cxr >  check phenotype    F/u 6 weeks with all meds in hand using a trust but verify approach to confirm accurate Medication  Reconciliation The principal here is that until we are certain that the  patients are doing what we've asked, it makes no sense to ask them to do more.          Each maintenance medication was reviewed in detail including emphasizing most importantly the difference between maintenance and prns and under what circumstances the prns are to be triggered using an action plan format where appropriate.  Total time for H and P, chart review, counseling, reviewing hfa device(s) and generating customized AVS unique to this office visit / same day charting = 50 min new pt eval for chornic   refractory respiratory  symptoms of uncertain etiology

## 2023-07-05 NOTE — Patient Instructions (Addendum)
 Pantoprazole  (protonix ) 40 mg   Take  30-60 min before first meal of the day and Pepcid (famotidine)  20 mg after supper until return to office - this is the best way to tell whether stomach acid is contributing to your problem.    GERD (REFLUX)  is an extremely common cause of respiratory symptoms just like yours , many times with no obvious heartburn at all.    It can be treated with medication, but also with lifestyle changes including elevation of the head of your bed (ideally with 6 -8inch blocks under the headboard of your bed),  Smoking cessation, avoidance of late meals, excessive alcohol, and avoid fatty foods, chocolate, peppermint, colas, red wine, and acidic juices such as orange juice.  NO MINT OR MENTHOL PRODUCTS SO NO COUGH DROPS  USE SUGARLESS CANDY INSTEAD (Jolley ranchers or Stover's or Life Savers) or even ice chips will also do - the key is to swallow to prevent all throat clearing. NO OIL BASED VITAMINS - use powdered substitutes.  Avoid fish oil when coughing.   Stop arnuity unless you are having more attacks or need more albuterol   Please remember to go to the lab department   for your tests - we will call you with the results when they are available.  Please schedule a follow up office visit in 6 weeks, call sooner if needed  Add: bring all meds

## 2023-07-08 LAB — CBC WITH DIFFERENTIAL/PLATELET
Basophils Absolute: 0 10*3/uL (ref 0.0–0.2)
Basos: 0 %
EOS (ABSOLUTE): 0.3 10*3/uL (ref 0.0–0.4)
Eos: 3 %
Hematocrit: 37.2 % (ref 34.0–46.6)
Hemoglobin: 11.9 g/dL (ref 11.1–15.9)
Immature Grans (Abs): 0 10*3/uL (ref 0.0–0.1)
Immature Granulocytes: 0 %
Lymphocytes Absolute: 2 10*3/uL (ref 0.7–3.1)
Lymphs: 26 %
MCH: 29.7 pg (ref 26.6–33.0)
MCHC: 32 g/dL (ref 31.5–35.7)
MCV: 93 fL (ref 79–97)
Monocytes Absolute: 0.5 10*3/uL (ref 0.1–0.9)
Monocytes: 7 %
Neutrophils Absolute: 4.8 10*3/uL (ref 1.4–7.0)
Neutrophils: 64 %
Platelets: 308 10*3/uL (ref 150–450)
RBC: 4.01 x10E6/uL (ref 3.77–5.28)
RDW: 12.8 % (ref 11.7–15.4)
WBC: 7.6 10*3/uL (ref 3.4–10.8)

## 2023-07-08 LAB — ALPHA-1-ANTITRYPSIN PHENOTYP: A-1 Antitrypsin: 151 mg/dL (ref 101–187)

## 2023-07-08 LAB — IGE: IgE (Immunoglobulin E), Serum: 18 [IU]/mL (ref 6–495)

## 2023-07-20 ENCOUNTER — Ambulatory Visit (INDEPENDENT_AMBULATORY_CARE_PROVIDER_SITE_OTHER)

## 2023-07-20 ENCOUNTER — Telehealth: Payer: Self-pay | Admitting: Nurse Practitioner

## 2023-07-20 ENCOUNTER — Encounter: Payer: Self-pay | Admitting: Emergency Medicine

## 2023-07-20 ENCOUNTER — Ambulatory Visit
Admission: EM | Admit: 2023-07-20 | Discharge: 2023-07-20 | Disposition: A | Discharge status: Home / Self Care | Attending: Nurse Practitioner | Admitting: Nurse Practitioner

## 2023-07-20 DIAGNOSIS — R600 Localized edema: Secondary | ICD-10-CM | POA: Diagnosis not present

## 2023-07-20 DIAGNOSIS — M85862 Other specified disorders of bone density and structure, left lower leg: Secondary | ICD-10-CM | POA: Diagnosis not present

## 2023-07-20 DIAGNOSIS — M7989 Other specified soft tissue disorders: Secondary | ICD-10-CM

## 2023-07-20 DIAGNOSIS — M1712 Unilateral primary osteoarthritis, left knee: Secondary | ICD-10-CM | POA: Diagnosis not present

## 2023-07-20 NOTE — ED Triage Notes (Signed)
 Left lower leg swollen and sore since this morning  Hx of blood clot in that leg.

## 2023-07-20 NOTE — ED Provider Notes (Signed)
 [facility]    CSN: [id number] Arrival date & time: 07/20/23  1259      History   Chief Complaint No chief complaint on file.   HPI Melanie Butler is a 78 y.o. female.   The history is provided by the patient.   Patient presents for complaints of swelling to the left lower leg.  Patient states she noticed the area on the front portion of her left lower leg this morning.  She states the area is sore to touch.  She denies any recent injury or trauma.  Patient reports that she did have a left hip replacement in March.  Patient states that she does have some swelling to the left lower leg at baseline, legs are not symmetrical in size per the patient's report.  Patient states that she was concerned about a blood clot.  Reports prior history of blood clot when she had her left hip surgery.  States that she is currently taking Eliquis.  Past Medical History:  Diagnosis Date   Hyperlipidemia    Thyroid  disease     Patient Active Problem List   Diagnosis Date Noted   DOE (dyspnea on exertion) 07/05/2023   Closed left hip fracture, initial encounter (HCC) 05/04/2023   HLD (hyperlipidemia) 05/04/2023   Hypothyroidism 05/04/2023   Asthma, chronic 05/04/2023   COPD/Emphysema 05/04/2023    Past Surgical History:  Procedure Laterality Date   TOTAL HIP ARTHROPLASTY Left 05/05/2023   Procedure: ARTHROPLASTY, HIP, TOTAL,POSTERIOR APPROACH;  Surgeon: Tonita Frater, MD;  Location: [facility];  Service: Orthopedics;  Laterality: Left;    OB History   No obstetric history on file.      Home Medications    Prior to Admission medications   Medication Sig Start Date End Date Taking? Authorizing Provider  albuterol  (VENTOLIN  HFA) 108 (90 Base) MCG/ACT inhaler Inhale 2 puffs into the lungs every 6 (six) hours as needed for wheezing or shortness of breath. 05/09/23   Colin Dawley, MD  cetirizine (ZYRTEC) 10 MG tablet Take 10 mg by mouth daily.    [provider]   Cholecalciferol  (HM VITAMIN D3) 4000 units CAPS Take 2,000 Units by mouth daily.    [provider]  diclofenac Sodium (VOLTAREN) 1 % GEL Apply 2 g topically 4 (four) times daily.    [provider]  ELIQUIS 5 MG TABS tablet Take 5 mg by mouth 2 (two) times daily. 05/27/23   [provider]  famotidine  (PEPCID ) 20 MG tablet One after supper 07/05/23   Wert, Michael B, MD  levothyroxine  (SYNTHROID ) 137 MCG tablet Take 1 tablet (137 mcg total) by mouth daily before breakfast. 05/09/23   Colin Dawley, MD  Multiple Vitamin (MULTIVITAMIN) tablet Take 1 tablet by mouth daily.    [provider]  pantoprazole  (PROTONIX ) 40 MG tablet Take 1 tablet (40 mg total) by mouth daily. Take 30-60 min before first meal of the day 07/05/23   Diamond Formica, MD  rosuvastatin  (CRESTOR ) 10 MG tablet Take 1 tablet (10 mg total) by mouth at bedtime. art Crestor  10 mg po at bedtime. No grapefruit juice 05/09/23 05/08/24  Colin Dawley, MD  traZODone  (DESYREL ) 50 MG tablet Take 1 tablet (50 mg total) by mouth at bedtime as needed for sleep. 05/09/23   Colin Dawley, MD    Family History Family History  Problem Relation Age of Onset   Lupus Mother    Lung cancer Father    Glaucoma Sister  Stroke Sister    Glaucoma Sister    Glaucoma Sister     Social History Social History   Tobacco Use   Smoking status: Never   Smokeless tobacco: Never  Substance Use Topics   Alcohol use: Never   Drug use: Never     Allergies   Ceclor [cefaclor] and Tetanus toxoids   Review of Systems Review of Systems Per HPI  Physical Exam Triage Vital Signs ED Triage Vitals  Encounter Vitals Group     BP 07/20/23 1312 (!) 155/76     Systolic BP Percentile --      Diastolic BP Percentile --      Pulse Rate 07/20/23 1312 62     Resp 07/20/23 1312 18     Temp 07/20/23 1312 98 F (36.7 C)     Temp Source 07/20/23 1312 Oral     SpO2 07/20/23 1312 94 %     Weight --      Height --       Head Circumference --      Peak Flow --      Pain Score 07/20/23 1314 1     Pain Loc --      Pain Education --      Exclude from Growth Chart --    No data found.  Updated Vital Signs BP (!) 155/76 (BP Location: Right Arm)   Pulse 62   Temp 98 F (36.7 C) (Oral)   Resp 18   SpO2 94%   Visual Acuity Right Eye Distance:   Left Eye Distance:   Bilateral Distance:    Right Eye Near:   Left Eye Near:    Bilateral Near:     Physical Exam Vitals and nursing note reviewed.  Constitutional:      General: She is not in acute distress.    Appearance: Normal appearance.  HENT:     Head: Normocephalic.  Eyes:     Extraocular Movements: Extraocular movements intact.     Conjunctiva/sclera: Conjunctivae normal.     Pupils: Pupils are equal, round, and reactive to light.  Cardiovascular:     Rate and Rhythm: Normal rate and regular rhythm.     Pulses: Normal pulses.     Heart sounds: Normal heart sounds.  Pulmonary:     Effort: Pulmonary effort is normal.     Breath sounds: Normal breath sounds.  Abdominal:     General: Bowel sounds are normal.     Palpations: Abdomen is soft.  Musculoskeletal:     Cervical back: Normal range of motion.     Left lower leg: Edema present.       Legs:  Skin:    General: Skin is warm and dry.  Neurological:     General: No focal deficit present.     Mental Status: She is alert and oriented to person, place, and time.  Psychiatric:        Mood and Affect: Mood normal.        Behavior: Behavior normal.      UC Treatments / Results  Labs (all labs ordered are listed, but only abnormal results are displayed) Labs Reviewed - No data to display  EKG   Radiology DG Tibia/Fibula Left Result Date: 07/20/2023 CLINICAL DATA:  Left lower extremity swelling. EXAM: LEFT TIBIA AND FIBULA - 2 VIEW COMPARISON:  None Available. FINDINGS: There is no acute fracture or dislocation. The bones are osteopenic. There is arthritic changes of the  left knee. Diffuse  subcutaneous edema. No soft tissue gas. IMPRESSION: 1. No acute fracture or dislocation. 2. Osteopenia. Electronically Signed   By: Angus Bark M.D.   On: 07/20/2023 15:35    Procedures Procedures (including critical care time)  Medications Ordered in UC Medications - No data to display  Initial Impression / Assessment and Plan / UC Course  I have reviewed the triage vital signs and the nursing notes.  Pertinent labs & imaging results that were available during my care of the patient were reviewed by me and considered in my medical decision making (see chart for details).  Patient with area of swelling to the anterior left lower leg.  There is no redness, swelling, or deformity present.  Celine Collard' sign is negative on exam.  Low suspicion for DVT, patient is currently on Eliquis for prior DVT during her hip surgery in March.  X-ray of the left lower leg is pending.  Supportive care recommendations were provided and discussed with the patient to include elevating the left lower extremity, considering use of TED hose or compression socks, and to monitor for worsening symptoms.  Also discussed strict ED follow-up precautions with the patient.  Patient advised to follow-up with PCP next week as scheduled.  Patient was in agreement with this plan of care and verbalizes understanding.  All questions were answered.  Patient stable for discharge.  Final Clinical Impressions(s) / UC Diagnoses   Final diagnoses:  Swelling of lower leg     Discharge Instructions      The x-ray of your left lower leg is pending.  You will be contacted when the results of the x-ray are received.  You will also have access to results via MyChart. Recommend the use of compression socks or TED hose to see if this helps with swelling. Elevate the left lower extremity above the level of the heart is much as possible while symptoms persist. Seek care immediately if you experience increased leg pain,  redness, increased swelling, or become unable to ambulate. Follow-up with your primary care physician as scheduled for next week. Follow-up as needed.    ED Prescriptions   None    PDMP not reviewed this encounter.   Hardy Lia, NP 07/20/23 1600

## 2023-07-20 NOTE — Telephone Encounter (Signed)
 Received patient's x-ray results of her left tibia/fibula.  Spoke with patient, verified 2 patient identifiers.  Patient was advised x-ray was negative for fracture or dislocation but did show osteopenia.  Patient advised to follow-up with her PCP as scheduled.  Patient is in agreement with this plan of care and verbalized understanding.  All questions were answered.

## 2023-07-20 NOTE — Discharge Instructions (Signed)
 The x-ray of your left lower leg is pending.  You will be contacted when the results of the x-ray are received.  You will also have access to results via MyChart. Recommend the use of compression socks or TED hose to see if this helps with swelling. Elevate the left lower extremity above the level of the heart is much as possible while symptoms persist. Seek care immediately if you experience increased leg pain, redness, increased swelling, or become unable to ambulate. Follow-up with your primary care physician as scheduled for next week. Follow-up as needed.

## 2023-07-21 ENCOUNTER — Ambulatory Visit (HOSPITAL_COMMUNITY): Payer: Self-pay

## 2023-07-27 ENCOUNTER — Other Ambulatory Visit: Payer: Self-pay | Admitting: Internal Medicine

## 2023-07-27 DIAGNOSIS — M810 Age-related osteoporosis without current pathological fracture: Secondary | ICD-10-CM | POA: Diagnosis not present

## 2023-07-27 DIAGNOSIS — E785 Hyperlipidemia, unspecified: Secondary | ICD-10-CM | POA: Diagnosis not present

## 2023-07-27 DIAGNOSIS — J4520 Mild intermittent asthma, uncomplicated: Secondary | ICD-10-CM | POA: Diagnosis not present

## 2023-07-27 DIAGNOSIS — E039 Hypothyroidism, unspecified: Secondary | ICD-10-CM | POA: Diagnosis not present

## 2023-07-27 DIAGNOSIS — Z86718 Personal history of other venous thrombosis and embolism: Secondary | ICD-10-CM | POA: Diagnosis not present

## 2023-07-27 DIAGNOSIS — I700 Atherosclerosis of aorta: Secondary | ICD-10-CM | POA: Diagnosis not present

## 2023-07-27 DIAGNOSIS — R0609 Other forms of dyspnea: Secondary | ICD-10-CM

## 2023-08-02 ENCOUNTER — Ambulatory Visit: Admitting: Orthopedic Surgery

## 2023-08-02 ENCOUNTER — Encounter: Payer: Self-pay | Admitting: Orthopedic Surgery

## 2023-08-02 ENCOUNTER — Other Ambulatory Visit (INDEPENDENT_AMBULATORY_CARE_PROVIDER_SITE_OTHER): Payer: Self-pay

## 2023-08-02 DIAGNOSIS — S72002A Fracture of unspecified part of neck of left femur, initial encounter for closed fracture: Secondary | ICD-10-CM

## 2023-08-02 NOTE — Progress Notes (Signed)
 Orthopaedic Postop Note  Assessment: Melanie Butler is a 78 y.o. female s/p Left hip hemiarthroplasty  DOS: 05/05/2023  Plan: Melanie Butler is doing very well.  She has no pain.  She is walking without assistive device.  We did discuss the use of a cane to provide some additional support, and prevent any further falls.  She states understanding.  We also discussed treatment for osteoporosis.  She is discussing this with her primary care provider, and does not need a referral.  Anticipate that she will continue to improve and get stronger.  Walking is excellent exercise.  And that she has issues, she does not need to return to clinic.  Follow-up: Return if symptoms worsen or fail to improve. XR at next visit: [facility] pelvis and Left hip  Subjective:  Chief Complaint  Patient presents with   Post-op Follow-up    Left hip 05/05/23 hip replacement/ posterior/ for fracture improving.     History of Present Illness: Melanie Butler is a 78 y.o. female who presents following the above stated procedure.  She has recovered very well.  She is not having any pain in the right hip.  No issues with her surgical incision.  She is no longer using an assistive device.  She continues to take Eliquis after being diagnosed with a DVT.  Review of Systems: No fevers or chills No numbness or tingling No Chest Pain No shortness of breath   Objective: There were no vitals taken for this visit.  Physical Exam:  Alert and oriented.  No acute distress.  Ambulating well without assistive device.  No tenderness over the lateral hip.  She is able to make an a straight leg raise.  She tolerates gentle range of motion of the left hip.  Sensation is intact distally.  Active motion intact in the TA/EHL.    IMAGING: I personally ordered and reviewed the following images:  AP pelvis and left hip x-rays were obtained in clinic today.  These are compared to prior x-rays.  Left hip prosthesis in stable alignment.  No  evidence of subsidence.  No fractures.  No dislocation.  No change in overall alignment.  Impression: Left hip hemiarthroplasty in stable alignment, without subsidence.  Tonita Frater, MD 08/02/2023 9:59 AM

## 2023-08-14 NOTE — Progress Notes (Signed)
 Stefannie Defeo Winegardner, female    DOB: [birth year]    MRN: [id number]   Brief patient profile:  12 yowf never smoker with bad asthma as child outgrew but continue allergies spring >  fall (never saw allergist) worse since 2021  referred to pulmonary clinic in [place]  07/05/2023 by Dr Teresa  for sob lasts for  breath or two since 2023.   History of Present Illness  07/05/2023  Pulmonary/ 1st office eval/ [facility] Stambaugh / [place] [facility]  Chief Complaint  Patient presents with   Shortness of Breath  Dyspnea:  avg spells once a week  Cough: very raspy voice but no real cough/  onset with breathing problem Sleep: bed is flat / big fluffy pillow s noct resp cc  SABA use: has alb not really quick enough  02: none  Rec Pantoprazole  (protonix ) 40 mg   Take  30-60 min before first meal of the day and Pepcid  (famotidine )  20 mg after supper until return to office   GERD diet reviewed, bed blocks rec  Stop arnuity unless you are having more attacks or need more albuterol  Please schedule a follow up office visit in 6 weeks, call sooner if needed  Add: bring all meds   - Allergy screen 07/05/2023 >  Eos 0. 3/ IgE  18  - Alpha one Phenotype  =  MM  level 151    08/16/2023  f/u ov/[place] office/Eriona Kinchen re: UACS  maint on gerd rx   Chief Complaint  Patient presents with   Follow-up  Dyspnea:  very transient sev breaths then resolves  Cough: seems to correlate with pnds with assoc itchy eyes  Sleeping: flat bed/ one pillow s  any resp cc  SABA use: couple times a month  02: none    No obvious day to day or daytime variability or assoc excess/ purulent sputum or mucus plugs or hemoptysis or cp or chest tightness, subjective wheeze or   hb symptoms.    Also denies any obvious fluctuation of symptoms with weather or environmental changes or other aggravating or alleviating factors except as outlined above   No unusual exposure hx or h/o childhood pna or knowledge of premature birth.  Current Allergies,  Complete Past Medical History, Past Surgical History, Family History, and Social History were reviewed in [employer] record.  ROS  The following are not active complaints unless bolded Hoarseness, sore throat, dysphagia, dental problems, itching, sneezing,  nasal congestion or discharge of excess mucus or purulent secretions, ear ache,   fever, chills, sweats, unintended wt loss or wt gain, classically pleuritic or exertional cp,  orthopnea pnd or arm/hand swelling  or leg swelling, presyncope, palpitations, abdominal pain, anorexia, nausea, vomiting, diarrhea  or change in bowel habits or change in bladder habits, change in stools or change in urine, dysuria, hematuria,  rash, arthralgias, visual complaints, headache, numbness, weakness or ataxia or problems with walking or coordination,  change in mood or  memory.        Current Meds  Medication Sig   albuterol  (VENTOLIN  HFA) 108 (90 Base) MCG/ACT inhaler Inhale 2 puffs into the lungs every 6 (six) hours as needed for wheezing or shortness of breath.   cetirizine (ZYRTEC) 10 MG tablet Take 10 mg by mouth daily.   Cholecalciferol  (HM VITAMIN D3) 4000 units CAPS Take 2,000 Units by mouth daily.   diclofenac Sodium (VOLTAREN) 1 % GEL Apply 2 g topically 4 (four) times daily.   ELIQUIS 5 MG  TABS tablet Take 5 mg by mouth 2 (two) times daily.   famotidine  (PEPCID ) 20 MG tablet One after supper   levothyroxine  (SYNTHROID ) 137 MCG tablet Take 1 tablet (137 mcg total) by mouth daily before breakfast.   Multiple Vitamin (MULTIVITAMIN) tablet Take 1 tablet by mouth daily.   pantoprazole  (PROTONIX ) 40 MG tablet TAKE 1 TABLET (40 MG TOTAL) BY MOUTH DAILY. TAKE 30-60 MIN BEFORE FIRST MEAL OF THE DAY   rosuvastatin  (CRESTOR ) 10 MG tablet Take 1 tablet (10 mg total) by mouth at bedtime. art Crestor  10 mg po at bedtime. No grapefruit juice   traZODone  (DESYREL ) 50 MG tablet Take 1 tablet (50 mg total) by mouth at bedtime as needed for  sleep.            Past Medical History:  Diagnosis Date   Hyperlipidemia    Thyroid  disease       Objective:    wts  08/16/2023       150   07/05/23 155 lb 6.4 oz (70.5 kg)  05/04/23 158 lb 11.7 oz (72 kg)  12/27/22 161 lb 9.6 oz (73.3 kg)    Vital signs reviewed  08/16/2023  - Note at rest 02 sats  98% on RA   General appearance:    slt raspy voiced amb wf nad/ walks with 3 pronged cane   HEENT : Oropharynx  clear      Nasal turbinates nl    NECK :  without  apparent JVD/ palpable Nodes/TM    LUNGS: no acc muscle use,  Nl contour chest which is clear to A and P bilaterally without cough on insp or exp maneuvers   CV:  RRR  no s3 or murmur or increase in P2, and no edema   ABD:  soft and nontender   MS:  Gait nl   ext warm without deformities Or obvious joint restrictions  calf tenderness, cyanosis or clubbing    SKIN: warm and dry without lesions    NEURO:  alert, approp, nl sensorium with  no motor or cerebellar deficits apparent.        Assessment

## 2023-08-16 ENCOUNTER — Ambulatory Visit: Admitting: Internal Medicine

## 2023-08-16 ENCOUNTER — Encounter: Payer: Self-pay | Admitting: Internal Medicine

## 2023-08-16 VITALS — BP 141/62 | HR 69 | Ht 63.0 in | Wt 150.6 lb

## 2023-08-16 DIAGNOSIS — R058 Other specified cough: Secondary | ICD-10-CM

## 2023-08-16 DIAGNOSIS — R0609 Other forms of dyspnea: Secondary | ICD-10-CM

## 2023-08-16 NOTE — Patient Instructions (Addendum)
 Leave off arnuity for now   Pantoprazole  40 - otc  prilosec 20 x 2   and pepcid  is otc also = 20 mg after supper   Only use your albuterol  as a rescue medication to be used if you can't catch your breath by resting or doing a relaxed purse lip breathing pattern.  - The less you use it, the better it will work when you need it. - Ok to use up to 2 puffs  every 4 hours if you must but call for immediate appointment if use goes up over your usual need - Don't leave home without it !!  (think of it like the spare tire for your car)   Also  Ok to try albuterol  15 min before an activity (on alternating days)  that you know would usually make you short of breath and see if it makes any difference and if makes none then don't take albuterol  after activity unless you can't catch your breath as this means it's the resting that helps, not the albuterol .  Call me if you want a referral to Allergy  - in the meantime ok to use zyrtec  Pulmlnary follow up is needed

## 2023-08-18 DIAGNOSIS — R058 Other specified cough: Secondary | ICD-10-CM | POA: Insufficient documentation

## 2023-08-18 NOTE — Assessment & Plan Note (Signed)
 Onset 20023 in setting of Allergic rhinitis - rx arnuity > d/c 07/05/2023 as no better and voice fatigue present - Allergy screen 07/05/2023 >  Eos 0. 3/ IgE  18 - max gerd rx 07/05/2023 >>>  - Alpha one Phenotype  =  MM  level 151  - 08/16/2023 d/c arnuity due to upper airway cough and use saba prn instead   Re SABA :  I spent extra time with pt today reviewing appropriate use of albuterol  for prn use on exertion with the following points: 1) saba is for relief of sob that does not improve by walking a slower pace or resting but rather if the pt does not improve after trying this first. 2) If the pt is convinced, as many are, that saba helps recover from activity faster then it's easy to tell if this is the case by re-challenging : ie stop, take the inhaler, then p 5 minutes try the exact same activity (intensity of workload) that just caused the symptoms and see if they are substantially diminished or not after saba 3) if there is an activity that reproducibly causes the symptoms, try the saba 15 min before the activity on alternate days   If in fact the saba really does help, then fine to continue to use it prn but advised may need to look closer at the maintenance regimen (was arnuity vs no maint rx)being used to achieve better control of airways disease with exertion.  If worse doe or more saba needed  then try symbicort 80 2bid

## 2023-08-18 NOTE — Assessment & Plan Note (Signed)
 Upper airway cough syndrome (previously labeled PNDS),  is so named because it's frequently impossible to sort out how much is  CR/sinusitis with freq throat clearing (which can be related to primary GERD)   vs  causing  secondary ( extra esophageal)  GERD from wide swings in gastric pressure that occur with throat clearing, often  promoting self use of mint and menthol lozenges that reduce the lower esophageal sphincter tone and exacerbate the problem further in a cyclical fashion.   These are the same pts (now being labeled as having irritable larynx syndrome by some cough centers) who not infrequently have a history of having failed to tolerate ace inhibitors,  dry powder inhalers(arnuity) or biphosphonates or report having atypical/extraesophageal reflux symptoms( that don't respond to standard doses of PPI  and are easily confused as having aecopd or asthma flares by even experienced allergists/ pulmonologists (myself included).   Will try zyrtec plus gerd rx and off arnuity and f/u with allergy if needed.   Pulmonary f/u can be prn if not needing any maintenance pulmonary meds.          Each maintenance medication was reviewed in detail including emphasizing most importantly the difference between maintenance and prns and under what circumstances the prns are to be triggered using an action plan format where appropriate.  Total time for H and P, chart review, counseling, reviewing hfa device(s) and generating customized AVS unique to this office visit / same day charting = 32 min

## 2023-09-22 DIAGNOSIS — E039 Hypothyroidism, unspecified: Secondary | ICD-10-CM | POA: Diagnosis not present

## 2023-09-22 DIAGNOSIS — E785 Hyperlipidemia, unspecified: Secondary | ICD-10-CM | POA: Diagnosis not present

## 2023-09-22 DIAGNOSIS — E559 Vitamin D deficiency, unspecified: Secondary | ICD-10-CM | POA: Diagnosis not present

## 2023-09-29 DIAGNOSIS — M19072 Primary osteoarthritis, left ankle and foot: Secondary | ICD-10-CM | POA: Diagnosis not present

## 2023-09-29 DIAGNOSIS — M810 Age-related osteoporosis without current pathological fracture: Secondary | ICD-10-CM | POA: Diagnosis not present

## 2023-09-29 DIAGNOSIS — M159 Polyosteoarthritis, unspecified: Secondary | ICD-10-CM | POA: Diagnosis not present

## 2023-09-29 DIAGNOSIS — J4520 Mild intermittent asthma, uncomplicated: Secondary | ICD-10-CM | POA: Diagnosis not present

## 2023-10-03 ENCOUNTER — Telehealth: Payer: Self-pay | Admitting: Orthopedic Surgery

## 2023-10-03 NOTE — Telephone Encounter (Signed)
 Dr. Onesimo pt - spoke w/Tabatha at [facility] [phone], they need to know the protocol for this patient for cleanings/dental work.

## 2023-10-13 ENCOUNTER — Telehealth: Payer: Self-pay | Admitting: Orthopedic Surgery

## 2023-10-13 NOTE — Telephone Encounter (Signed)
 Dr. Onesimo pt - pt lvm stating that she had surgery 05/05/23 and needs to have some dental work done.  She needs to know if she needs to know if she needs to take an antibiotic or not before she does.  [phone]

## 2023-10-14 NOTE — Telephone Encounter (Signed)
 Called and advised pt she does not need antibiotics. No further questions or concerns at this time.

## 2023-10-30 DIAGNOSIS — M159 Polyosteoarthritis, unspecified: Secondary | ICD-10-CM | POA: Diagnosis not present

## 2023-10-30 DIAGNOSIS — M19072 Primary osteoarthritis, left ankle and foot: Secondary | ICD-10-CM | POA: Diagnosis not present

## 2023-10-30 DIAGNOSIS — M810 Age-related osteoporosis without current pathological fracture: Secondary | ICD-10-CM | POA: Diagnosis not present

## 2023-10-30 DIAGNOSIS — J4520 Mild intermittent asthma, uncomplicated: Secondary | ICD-10-CM | POA: Diagnosis not present

## 2023-11-29 DIAGNOSIS — M159 Polyosteoarthritis, unspecified: Secondary | ICD-10-CM | POA: Diagnosis not present

## 2023-11-29 DIAGNOSIS — J4520 Mild intermittent asthma, uncomplicated: Secondary | ICD-10-CM | POA: Diagnosis not present

## 2023-11-29 DIAGNOSIS — M19072 Primary osteoarthritis, left ankle and foot: Secondary | ICD-10-CM | POA: Diagnosis not present

## 2023-11-29 DIAGNOSIS — M810 Age-related osteoporosis without current pathological fracture: Secondary | ICD-10-CM | POA: Diagnosis not present

## 2023-12-30 DIAGNOSIS — M19072 Primary osteoarthritis, left ankle and foot: Secondary | ICD-10-CM | POA: Diagnosis not present

## 2023-12-30 DIAGNOSIS — M159 Polyosteoarthritis, unspecified: Secondary | ICD-10-CM | POA: Diagnosis not present

## 2023-12-30 DIAGNOSIS — M810 Age-related osteoporosis without current pathological fracture: Secondary | ICD-10-CM | POA: Diagnosis not present

## 2023-12-30 DIAGNOSIS — J4520 Mild intermittent asthma, uncomplicated: Secondary | ICD-10-CM | POA: Diagnosis not present

## 2024-01-31 DIAGNOSIS — K5730 Diverticulosis of large intestine without perforation or abscess without bleeding: Secondary | ICD-10-CM | POA: Diagnosis not present

## 2024-01-31 DIAGNOSIS — Z1211 Encounter for screening for malignant neoplasm of colon: Secondary | ICD-10-CM | POA: Diagnosis not present

## 2024-01-31 DIAGNOSIS — Z860100 Personal history of colon polyps, unspecified: Secondary | ICD-10-CM | POA: Diagnosis not present

## 2024-02-07 ENCOUNTER — Telehealth: Payer: Self-pay

## 2024-02-07 NOTE — Telephone Encounter (Signed)
 [facility] left message wanting a call back in regards to this patient. I had to guess at the patient because they only left her name.  Please call and advise at [phone]

## 2024-02-08 ENCOUNTER — Telehealth: Payer: Self-pay | Admitting: Orthopedic Surgery

## 2024-02-08 NOTE — Telephone Encounter (Signed)
 Dr. Onesimo pt - spoke w/Jotti w/[facility] [phone], she needs to know if the pt needs to pre-med before treatment, the pt told them she had a hip replacement.  She needs a letter of advising sent to F# [phone].

## 2024-02-08 NOTE — Telephone Encounter (Signed)
 Called and information given to office staff.
